# Patient Record
Sex: Male | Born: 1995 | Race: White | Hispanic: No | Marital: Single | Smoking: Former smoker
Health system: Southern US, Community
[De-identification: ages and names within clinical notes are randomized; demographics above are authoritative.]

## PROBLEM LIST (undated history)

## (undated) DIAGNOSIS — F988 Other specified behavioral and emotional disorders with onset usually occurring in childhood and adolescence: Secondary | ICD-10-CM

## (undated) DIAGNOSIS — F419 Anxiety disorder, unspecified: Secondary | ICD-10-CM

## (undated) HISTORY — PX: NO PAST SURGERIES: SHX2092

---
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
## 2018-07-16 ENCOUNTER — Ambulatory Visit
Admission: EM | Admit: 2018-07-16 | Discharge: 2018-07-16 | Disposition: A | Discharge status: Home / Self Care | Payer: BC Managed Care – PPO | Attending: Family Medicine | Admitting: Family Medicine

## 2018-07-16 ENCOUNTER — Other Ambulatory Visit: Payer: Self-pay

## 2018-07-16 ENCOUNTER — Encounter: Payer: Self-pay | Admitting: Emergency Medicine

## 2018-07-16 DIAGNOSIS — R51 Headache: Secondary | ICD-10-CM

## 2018-07-16 DIAGNOSIS — R6883 Chills (without fever): Secondary | ICD-10-CM

## 2018-07-16 DIAGNOSIS — J45909 Unspecified asthma, uncomplicated: Secondary | ICD-10-CM

## 2018-07-16 DIAGNOSIS — J111 Influenza due to unidentified influenza virus with other respiratory manifestations: Secondary | ICD-10-CM

## 2018-07-16 DIAGNOSIS — J029 Acute pharyngitis, unspecified: Secondary | ICD-10-CM

## 2018-07-16 DIAGNOSIS — R05 Cough: Secondary | ICD-10-CM | POA: Diagnosis not present

## 2018-07-16 DIAGNOSIS — R69 Illness, unspecified: Principal | ICD-10-CM

## 2018-07-16 HISTORY — DX: Other specified behavioral and emotional disorders with onset usually occurring in childhood and adolescence: F98.8

## 2018-07-16 HISTORY — DX: Anxiety disorder, unspecified: F41.9

## 2018-07-16 LAB — RAPID STREP SCREEN (MED CTR MEBANE ONLY): Streptococcus, Group A Screen (Direct): NEGATIVE

## 2018-07-16 MED ORDER — FLUTICASONE PROPIONATE 50 MCG/ACT NA SUSP: 1.0000 | Freq: Every day | NASAL | 2 refills | Status: DC

## 2018-07-16 MED ORDER — ALBUTEROL SULFATE HFA 108 (90 BASE) MCG/ACT IN AERS: 1.0000 | INHALATION_SPRAY | Freq: Four times a day (QID) | RESPIRATORY_TRACT | 0 refills | Status: DC | PRN

## 2018-07-16 MED ORDER — OSELTAMIVIR PHOSPHATE 75 MG PO CAPS
75.0000 mg | ORAL_CAPSULE | Freq: Two times a day (BID) | ORAL | 0 refills | Status: DC
Start: 2018-07-16 — End: 2018-11-16

## 2018-07-16 NOTE — ED Triage Notes (Signed)
Pt c/o cough, body aches, sore throat and chills. Started this morning.

## 2018-07-16 NOTE — ED Provider Notes (Signed)
[facility]    CSN: [id number] Arrival date & time: 07/16/18  1949     History   Chief Complaint Chief Complaint  Patient presents with  . Generalized Body Aches    HPI Jason Griffin is a 23 y.o. male.   Patient is a 23 year old male who presents with body aches, cough, headaches, chills, sore throat that started this morning.  Patient is taking no over-the-counter meds but did take some elderberry syrup from his mother.  Patient denies any sick contacts.  He also reports some nausea.  Patient reports some chest pain shortness of breath with his coughing fits but not otherwise.  No abdominal pain.     Past Medical History:  Diagnosis Date  . ADD (attention deficit disorder)   . Anxiety     There are no active problems to display for this patient.   Past Surgical History:  Procedure Laterality Date  . NO PAST SURGERIES        Home Medications    Prior to Admission medications   Medication Sig Start Date End Date Taking? Authorizing Provider  amphetamine-dextroamphetamine (ADDERALL XR) 20 MG 24 hr capsule  07/04/18  Yes [provider]  sertraline (ZOLOFT) 100 MG tablet  04/18/18  Yes [provider]  albuterol (PROVENTIL HFA;VENTOLIN HFA) 108 (90 Base) MCG/ACT inhaler Inhale 1-2 puffs into the lungs every 6 (six) hours as needed for wheezing or shortness of breath. 07/16/18   Candis Schatz, PA-C  fluticasone (FLONASE) 50 MCG/ACT nasal spray Place 1 spray into both nostrils daily. 07/16/18   Candis Schatz, PA-C  oseltamivir (TAMIFLU) 75 MG capsule Take 1 capsule (75 mg total) by mouth every 12 (twelve) hours. 07/16/18   Candis Schatz, PA-C    Family History Family History  Problem Relation Age of Onset  . Autoimmune disease Mother   . Healthy Father     Social History Social History   Tobacco Use  . Smoking status: Current Every Day Smoker    Packs/day: 0.50  . Smokeless tobacco: Never Used  Substance Use Topics  .  Alcohol use: Yes    Comment: occasionally  . Drug use: Not Currently     Allergies   Amoxil [amoxicillin]   Review of Systems Review of Systems  As above HPI.  Other systems reviewed and found to be negative.   Physical Exam Triage Vital Signs ED Triage Vitals  Enc Vitals Group     BP 07/16/18 2019 (!) 143/101     Pulse Rate 07/16/18 2019 (!) 132     Resp 07/16/18 2019 18     Temp 07/16/18 2019 99.8 F (37.7 C)     Temp Source 07/16/18 2019 Oral     SpO2 07/16/18 2019 96 %     Weight 07/16/18 2018 150 lb (68 kg)     Height 07/16/18 2018 5' 9.5" (1.765 m)     Head Circumference --      Peak Flow --      Pain Score 07/16/18 2014 6     Pain Loc --      Pain Edu? --      Excl. in [place]? --    No data found.  Updated Vital Signs BP (!) 143/101 (BP Location: Left Arm)   Pulse (!) 132   Temp 99.8 F (37.7 C) (Oral)   Resp 18   Ht 5' 9.5" (1.765 m)   Wt 150 lb (68 kg)   SpO2 96%  BMI 21.83 kg/m    Physical Exam Constitutional:      Appearance: He is ill-appearing.     Comments: Lying on exam table curled up, wearing acuity with frequent will go over his face  HENT:     Ears:     Comments: Bilateral middle ear effusion    Nose: Nose normal.     Mouth/Throat:     Mouth: Mucous membranes are moist.     Comments: Clear postnasal drainage Eyes:     Extraocular Movements: Extraocular movements intact.     Pupils: Pupils are equal, round, and reactive to light.  Neck:     Musculoskeletal: Normal range of motion.  Cardiovascular:     Rate and Rhythm: Regular rhythm. Tachycardia present.     Heart sounds: No murmur.  Pulmonary:     Effort: Pulmonary effort is normal.     Breath sounds: No wheezing or rhonchi.     Comments: Coughed on forced expiration Abdominal:     General: Abdomen is flat.  Skin:    General: Skin is warm and dry.  Neurological:     General: No focal deficit present.     Mental Status: He is alert and oriented to person, place, and time.        UC Treatments / Results  Labs (all labs ordered are listed, but only abnormal results are displayed) Labs Reviewed  RAPID STREP SCREEN ([facility] ONLY)  CULTURE, GROUP A STREP [facility])    EKG None  Radiology No results found.  Procedures Procedures (including critical care time)  Medications Ordered in UC Medications - No data to display  Initial Impression / Assessment and Plan / UC Course  I have reviewed the triage vital signs and the nursing notes.  Pertinent labs & imaging results that were available during my care of the patient were reviewed by me and considered in my medical decision making (see chart for details).     Patient presents with flulike symptoms that started this morning.  Patient also with a cough with forced expiration.  Patient given prescription for Tamiflu for his flulike illness and albuterol for his reactive airway.  Patient also given prescription for Flonase to help with his drainage.  Patient can take over-the-counter medications as needed for pain.  Push fluids.  Follow with primary care provider as needed.  Final Clinical Impressions(s) / UC Diagnoses   Final diagnoses:  Influenza-like illness  Reactive airway disease without complication, unspecified asthma severity, unspecified whether persistent     Discharge Instructions     -Tamiflu: 1 tablet twice a day for 5 days -Albuterol: 1 to 2 puffs in the lungs every 6 hours as needed for cough or wheezing -Flonase: 1 spray each nostril every morning -Over-the-counter medications as needed for pain -Push fluids -Follow with primary care provider as needed    ED Prescriptions    Medication Sig Dispense Auth. Provider   albuterol (PROVENTIL HFA;VENTOLIN HFA) 108 (90 Base) MCG/ACT inhaler Inhale 1-2 puffs into the lungs every 6 (six) hours as needed for wheezing or shortness of breath. 1 Inhaler Candis Schatz, PA-C   oseltamivir (TAMIFLU) 75 MG capsule Take 1 capsule (75 mg  total) by mouth every 12 (twelve) hours. 10 capsule Candis Schatz, PA-C   fluticasone [facility]) 50 MCG/ACT nasal spray Place 1 spray into both nostrils daily. 16 g Candis Schatz, PA-C     Controlled Substance Prescriptions [place] Controlled Substance Registry consulted? Not Applicable  Candis SchatzHarris, Oren Barella D, PA-C 07/16/18 2151

## 2018-07-16 NOTE — Discharge Instructions (Addendum)
-  Tamiflu: 1 tablet twice a day for 5 days -Albuterol: 1 to 2 puffs in the lungs every 6 hours as needed for cough or wheezing -Flonase: 1 spray each nostril every morning -Over-the-counter medications as needed for pain -Push fluids -Follow with primary care provider as needed

## 2018-07-19 LAB — CULTURE, GROUP A STREP (THRC)

## 2018-11-16 ENCOUNTER — Encounter: Payer: Self-pay | Admitting: Nurse Practitioner

## 2018-11-16 ENCOUNTER — Ambulatory Visit (INDEPENDENT_AMBULATORY_CARE_PROVIDER_SITE_OTHER): Payer: BC Managed Care – PPO | Admitting: Nurse Practitioner

## 2018-11-16 ENCOUNTER — Other Ambulatory Visit: Payer: Self-pay

## 2018-11-16 VITALS — Wt 169.0 lb

## 2018-11-16 DIAGNOSIS — F909 Attention-deficit hyperactivity disorder, unspecified type: Secondary | ICD-10-CM | POA: Diagnosis not present

## 2018-11-16 DIAGNOSIS — F17210 Nicotine dependence, cigarettes, uncomplicated: Secondary | ICD-10-CM

## 2018-11-16 DIAGNOSIS — F411 Generalized anxiety disorder: Secondary | ICD-10-CM

## 2018-11-16 MED ORDER — SERTRALINE HCL 100 MG PO TABS: 100.0000 mg | ORAL_TABLET | Freq: Every day | ORAL | 2 refills | Status: DC

## 2018-11-16 MED ORDER — AMPHETAMINE-DEXTROAMPHET ER 20 MG PO CP24: 20.0000 mg | ORAL_CAPSULE | Freq: Every day | ORAL | 0 refills | Status: DC

## 2018-11-16 NOTE — Patient Instructions (Signed)

## 2018-11-16 NOTE — Assessment & Plan Note (Signed)
Chronic, ongoing.  Continue current medication regimen, Adderall XR 20 MG daily.  Obtain UDS and controlled substance contract at visit in 4 weeks.  Refill sent on Adderall at his time #30 with 0 refills.

## 2018-11-16 NOTE — Progress Notes (Signed)
New Patient Office Visit  Subjective:  Patient ID: Jason Griffin, male    DOB: [birth year]  Age: 23 y.o. MRN: [id number]  CC:  Chief Complaint  Patient presents with  . Establish Care  . Anxiety    . This visit was completed via WebEx due to the restrictions of the COVID-19 pandemic. All issues as above were discussed and addressed. Physical exam was done as above through visual confirmation on WebEx. If it was felt that the patient should be evaluated in the office, they were directed there. The patient verbally consented to this visit. . Location of the patient: home . Location of the provider: home . Those involved with this call:  . Provider: Aura Dials, DNP . CMA: Wilhemena Durie, CMA . Front Desk/Registration: Harriet Pho  . Time spent on call: 15 minutes with patient face to face via video conference. More than 50% of this time was spent in counseling and coordination of care. 10 minutes total spent in review of patient's record and preparation of their chart. I verified patient identity using two factors (patient name and date of birth). Patient consents verbally to being seen via telemedicine visit today.   HPI Jason Griffin presents for new patient visit to establish care.  Introduced to Publishing rights manager role and practice setting.  All questions answered.  ANXIETY/STRESS Currently takes Sertraline 100 MG daily.  Has been present since high school, started taking in 10th grade and reports good control on current dose, not interested in increasing at this time.  Duration:controlled Anxious mood: no  Excessive worrying: no Irritability: no  Sweating: no Nausea: no Palpitations:no Hyperventilation: no Panic attacks: note recently, has been over a year Agoraphobia: no  Obscessions/compulsions: no Depressed mood: at times Depression screen [facility] 2/9 11/16/2018  Decreased Interest 1  Down, Depressed, Hopeless 1  PHQ - 2 Score 2  Altered sleeping 2   Tired, decreased energy 0  Change in appetite 1  Feeling bad or failure about yourself  1  Trouble concentrating 0  Moving slowly or fidgety/restless 0  Suicidal thoughts 0  PHQ-9 Score 6  Difficult doing work/chores Not difficult at all   Anhedonia: no Weight changes: no Insomnia: none Hypersomnia: no Fatigue/loss of energy: no Feelings of worthlessness: no Feelings of guilt: no Impaired concentration/indecisiveness: no Suicidal ideations: no  Crying spells: no Recent Stressors/Life Changes: no   Relationship problems: no   Family stress: no     Financial stress: no    Job stress: no    Recent death/loss: no  GAD 7 : Generalized Anxiety Score 11/16/2018  Nervous, Anxious, on Edge 2  Control/stop worrying 2  Worry too much - different things 2  Trouble relaxing 2  Restless 2  Easily annoyed or irritable 2  Afraid - awful might happen 2  Total GAD 7 Score 14  Anxiety Difficulty Not difficult at all    ADHD FOLLOW UP Currently take Adderall XR 20 MG daily.  Elyn Peers MD at [facility] pediatrics/psychiatric initially diagnosed and prescribed current medications, but he has now aged out of pediatrics and needs to establish with PCP.  Has been on it since high school, took period off after high school, but then restarted as was not able to work well without it.  Does need refills at this time as is out of medication.  We discussed that with Adderall being a controlled substance he will need to have every 3 month visits in office and require drug screen  annually and controlled substance contract.  He states this is similar to this previous practice in [facility] and agrees with this plan.  Reports good control with current regimen and is able to perform well at work.  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    ADHD status: controlled Satisfied with current therapy: yes Medication compliance:  good compliance Controlled substance contract: no  Previous psychiatry evaluation: yes Previous medications: none Taking meds on weekends/vacations: occasionally on Sunday he skips if if not working Work/school performance:  good Difficulty sustaining attention/completing tasks: no Distracted by extraneous stimuli: no Does not listen when spoken to: no  Fidgets with hands or feet: no Unable to stay in seat: no Blurts out/interrupts others: no ADHD Medication Side Effects: no    Decreased appetite: no    Headache: no    Sleeping disturbance pattern: no    Irritability: no    Rebound effects (worse than baseline) off medication: no    Anxiousness: no    Dizziness: no    Tics: no   NICOTINE DEPENDENCE: Smokes 1/2 PPD and is not interested in quitting at this time.  Denies cough, fever, or SOB.  Past Medical History:  Diagnosis Date  . ADD (attention deficit disorder)   . Anxiety     Past Surgical History:  Procedure Laterality Date  . NO PAST SURGERIES      Family History  Problem Relation Age of Onset  . Autoimmune disease Mother   . Arthritis Mother   . Healthy Father   . Dementia Maternal Grandmother     Social History   Socioeconomic History  . Marital status: Single    Spouse name: Not on file  . Number of children: Not on file  . Years of education: Not on file  . Highest education level: Not on file  Occupational History  . Not on file  Social Needs  . Financial resource strain: Not hard at all  . Food insecurity:    Worry: Never true    Inability: Never true  . Transportation needs:    Medical: No    Non-medical: No  Tobacco Use  . Smoking status: Current Every Day Smoker    Packs/day: 0.50  . Smokeless tobacco: Former Engineer, water and Sexual Activity  . Alcohol use: Yes    Comment: occasionally  . Drug use: Never  . Sexual activity: Not Currently  Lifestyle  . Physical activity:    Days per week: 2 days    Minutes per session: 30 min  . Stress: Only a little  Relationships  . Social  connections:    Talks on phone: More than three times a week    Gets together: Three times a week    Attends religious service: Never    Active member of club or organization: No    Attends meetings of clubs or organizations: Never    Relationship status: Never married  . Intimate partner violence:    Fear of current or ex partner: No    Emotionally abused: No    Physically abused: No    Forced sexual activity: No  Other Topics Concern  . Not on file  Social History Narrative  . Not on file    ROS Review of Systems  Constitutional: Negative for activity change, diaphoresis, fatigue and fever.  Respiratory: Negative for cough, chest tightness, shortness of breath and wheezing.   Cardiovascular: Negative for chest pain, palpitations and leg  swelling.  Gastrointestinal: Negative for abdominal distention, abdominal pain, constipation, diarrhea, nausea and vomiting.  Musculoskeletal: Negative.   Skin: Negative.   Neurological: Negative for dizziness, syncope, weakness, light-headedness, numbness and headaches.  Psychiatric/Behavioral: Negative for behavioral problems, decreased concentration, self-injury, sleep disturbance and suicidal ideas. The patient is not nervous/anxious.     Objective:   Today's Vitals: Wt 169 lb (76.7 kg) Comment: pt reported- virtual visit  BMI 24.60 kg/m   Physical Exam Vitals signs and nursing note reviewed.  Constitutional:      General: He is awake. He is not in acute distress.    Appearance: He is well-developed. He is not ill-appearing.  HENT:     Head: Normocephalic.     Right Ear: Hearing normal. No drainage.     Left Ear: Hearing normal. No drainage.  Eyes:     General: Lids are normal.        Right eye: No discharge.        Left eye: No discharge.     Conjunctiva/sclera: Conjunctivae normal.  Neck:     Musculoskeletal: Normal range of motion.  Cardiovascular:     Comments: Unable to auscultate due to virtual exam only. Pulmonary:      Effort: Pulmonary effort is normal. No accessory muscle usage or respiratory distress.     Comments: Unable to auscultate due to virtual exam only. Neurological:     Mental Status: He is alert and oriented to person, place, and time.  Psychiatric:        Mood and Affect: Mood normal.        Behavior: Behavior normal. Behavior is cooperative.        Thought Content: Thought content normal.        Judgment: Judgment normal.     Assessment & Plan:   Problem List Items Addressed This Visit      Other   GAD (generalized anxiety disorder)    Chronic, ongoing with medication since 10th grade.  Continue current medication regimen, Sertraline 100 MG daily.  He denies SI/HI.  Adjust dose as needed.      Relevant Medications   sertraline (ZOLOFT) 100 MG tablet   ADHD (attention deficit hyperactivity disorder) - Primary    Chronic, ongoing.  Continue current medication regimen, Adderall XR 20 MG daily.  Obtain UDS and controlled substance contract at visit in 4 weeks.  Refill sent on Adderall at his time #30 with 0 refills.        Nicotine dependence, cigarettes, uncomplicated    I have recommended complete cessation of tobacco use. I have discussed various options available for assistance with tobacco cessation including over the counter methods (Nicotine gum, patch and lozenges). We also discussed prescription options (Chantix, Nicotine Inhaler / Nasal Spray). The patient is not interested in pursuing any prescription tobacco cessation options at this time.         Outpatient Encounter Medications as of 11/16/2018  Medication Sig  . amphetamine-dextroamphetamine (ADDERALL XR) 20 MG 24 hr capsule Take 1 capsule (20 mg total) by mouth daily.  . sertraline (ZOLOFT) 100 MG tablet Take 1 tablet (100 mg total) by mouth daily.  . [DISCONTINUED] amphetamine-dextroamphetamine (ADDERALL XR) 20 MG 24 hr capsule Take 20 mg by mouth daily.   . [DISCONTINUED] sertraline (ZOLOFT) 100 MG tablet Take 100  mg by mouth daily.   . [DISCONTINUED] albuterol (PROVENTIL HFA;VENTOLIN HFA) 108 (90 Base) MCG/ACT inhaler Inhale 1-2 puffs into the lungs every 6 (six) hours as needed for  wheezing or shortness of breath.  . [DISCONTINUED] fluticasone (FLONASE) 50 MCG/ACT nasal spray Place 1 spray into both nostrils daily.  . [DISCONTINUED] oseltamivir (TAMIFLU) 75 MG capsule Take 1 capsule (75 mg total) by mouth every 12 (twelve) hours.   No facility-administered encounter medications on file as of 11/16/2018.     Follow-up: Return in about 4 weeks (around 12/14/2018) for New patient physical face to face -- needs UDS and labs.   Marjie Skiff, NP   I discussed the assessment and treatment plan with the patient. The patient was provided an opportunity to ask questions and all were answered. The patient agreed with the plan and demonstrated an understanding of the instructions.   The patient was advised to call back or seek an in-person evaluation if the symptoms worsen or if the condition fails to improve as anticipated.   I provided 21+ minutes of time during this encounter.

## 2018-11-16 NOTE — Assessment & Plan Note (Signed)
I have recommended complete cessation of tobacco use. I have discussed various options available for assistance with tobacco cessation including over the counter methods (Nicotine gum, patch and lozenges). We also discussed prescription options (Chantix, Nicotine Inhaler / Nasal Spray). The patient is not interested in pursuing any prescription tobacco cessation options at this time.  

## 2018-11-16 NOTE — Assessment & Plan Note (Addendum)
Chronic, ongoing with medication since 10th grade.  Continue current medication regimen, Sertraline 100 MG daily.  He denies SI/HI.  Adjust dose as needed. 

## 2018-12-01 ENCOUNTER — Other Ambulatory Visit: Payer: Self-pay

## 2018-12-01 ENCOUNTER — Encounter: Payer: Self-pay | Admitting: Emergency Medicine

## 2018-12-01 ENCOUNTER — Ambulatory Visit
Admission: EM | Admit: 2018-12-01 | Discharge: 2018-12-01 | Disposition: A | Discharge status: Home / Self Care | Payer: BC Managed Care – PPO | Attending: Family Medicine | Admitting: Family Medicine

## 2018-12-01 DIAGNOSIS — J039 Acute tonsillitis, unspecified: Secondary | ICD-10-CM | POA: Diagnosis not present

## 2018-12-01 DIAGNOSIS — F17210 Nicotine dependence, cigarettes, uncomplicated: Secondary | ICD-10-CM

## 2018-12-01 LAB — RAPID STREP SCREEN (MED CTR MEBANE ONLY): Streptococcus, Group A Screen (Direct): NEGATIVE

## 2018-12-01 MED ORDER — LIDOCAINE VISCOUS HCL 2 % MT SOLN: OROMUCOSAL | 0 refills | Status: DC

## 2018-12-01 MED ORDER — CLINDAMYCIN HCL 300 MG PO CAPS: 300.0000 mg | ORAL_CAPSULE | Freq: Three times a day (TID) | ORAL | 0 refills | Status: DC

## 2018-12-01 NOTE — ED Triage Notes (Signed)
Patient c/o sore throat that started this morning. Denies fever.

## 2018-12-01 NOTE — ED Provider Notes (Signed)
[facility]    CSN: [id number] Arrival date & time: 12/01/18  1556     History   Chief Complaint Chief Complaint  Patient presents with  . Sore Throat    HPI Jason Griffin is a 23 y.o. male.   23 yo male with a c/o sore throat and pain with swallowing since this morning. Denies any fevers, chills, drooling, difficulty swallowing,  shortness of breath, congestion, cough.    Sore Throat    Past Medical History:  Diagnosis Date  . ADD (attention deficit disorder)   . Anxiety     Patient Active Problem List   Diagnosis Date Noted  . GAD (generalized anxiety disorder) 11/16/2018  . ADHD (attention deficit hyperactivity disorder) 11/16/2018  . Nicotine dependence, cigarettes, uncomplicated 95/02/3266    Past Surgical History:  Procedure Laterality Date  . NO PAST SURGERIES         Home Medications    Prior to Admission medications   Medication Sig Start Date End Date Taking? Authorizing Provider  amphetamine-dextroamphetamine (ADDERALL XR) 20 MG 24 hr capsule Take 1 capsule (20 mg total) by mouth daily. 11/16/18  Yes Cannady, [name] T, NP  sertraline (ZOLOFT) 100 MG tablet Take 1 tablet (100 mg total) by mouth daily. 11/16/18  Yes Cannady, [name] T, NP  clindamycin (CLEOCIN) 300 MG capsule Take 1 capsule (300 mg total) by mouth 3 (three) times daily. 12/01/18   Norval Gable, MD  lidocaine (XYLOCAINE) 2 % solution 20 ml gargle and spit q 6 hours prn sore throat 12/01/18   Norval Gable, MD    Family History Family History  Problem Relation Age of Onset  . Autoimmune disease Mother   . Arthritis Mother   . Healthy Father   . Dementia Maternal Grandmother     Social History Social History   Tobacco Use  . Smoking status: Current Every Day Smoker    Packs/day: 0.50  . Smokeless tobacco: Former Network engineer Use Topics  . Alcohol use: Yes    Comment: occasionally  . Drug use: Never     Allergies   Amoxil [amoxicillin]   Review  of Systems Review of Systems   Physical Exam Triage Vital Signs ED Triage Vitals  Enc Vitals Group     BP 12/01/18 1607 (!) 130/95     Pulse Rate 12/01/18 1607 83     Resp 12/01/18 1607 18     Temp 12/01/18 1607 98.5 F (36.9 C)     Temp Source 12/01/18 1607 Oral     SpO2 12/01/18 1607 99 %     Weight 12/01/18 1608 160 lb (72.6 kg)     Height 12/01/18 1608 5\' 10"  (1.778 m)     Head Circumference --      Peak Flow --      Pain Score 12/01/18 1606 8     Pain Loc --      Pain Edu? --      Excl. in [place]? --    No data found.  Updated Vital Signs BP (!) 130/95 (BP Location: Right Arm)   Pulse 83   Temp 98.5 F (36.9 C) (Oral)   Resp 18   Ht 5\' 10"  (1.778 m)   Wt 72.6 kg   SpO2 99%   BMI 22.96 kg/m   Visual Acuity Right Eye Distance:   Left Eye Distance:   Bilateral Distance:    Right Eye Near:   Left Eye Near:    Bilateral Near:  Physical Exam Vitals signs and nursing note reviewed.  Constitutional:      General: He is not in acute distress.    Appearance: He is not toxic-appearing or diaphoretic.  HENT:     Mouth/Throat:     Pharynx: Oropharyngeal exudate and posterior oropharyngeal erythema present.     Tonsils: Tonsillar exudate present. No tonsillar abscesses. 2+ on the right. 2+ on the left.  Neck:     Musculoskeletal: Neck supple. No neck rigidity or muscular tenderness.  Neurological:     Mental Status: He is alert.      UC Treatments / Results  Labs (all labs ordered are listed, but only abnormal results are displayed) Labs Reviewed  RAPID STREP SCREEN ([facility] ONLY)  CULTURE, GROUP A STREP [facility]    EKG None  Radiology No results found.  Procedures Procedures (including critical care time)  Medications Ordered in UC Medications - No data to display  Initial Impression / Assessment and Plan / UC Course  I have reviewed the triage vital signs and the nursing notes.  Pertinent labs & imaging results that were available  during my care of the patient were reviewed by me and considered in my medical decision making (see chart for details).      Final Clinical Impressions(s) / UC Diagnoses   Final diagnoses:  Tonsillitis    ED Prescriptions    Medication Sig Dispense Auth. Provider   clindamycin (CLEOCIN) 300 MG capsule Take 1 capsule (300 mg total) by mouth 3 (three) times daily. 30 capsule Zuhayr Deeney, MD   lidocaine (XYLOCAINE) 2 % solution 20 ml gargle and spit q 6 hours prn sore throat 100 mL Payton Mccallumonty, Jianna Drabik, MD     1. Lab results and diagnosis reviewed with patient 2. rx as per orders above; reviewed possible side effects, interactions, risks and benefits  3. Recommend supportive treatment with otc analgesics prn 4. Follow-up prn if symptoms worsen or don't improve  Controlled Substance Prescriptions [place] Controlled Substance Registry consulted? Not Applicable   Payton Mccallumonty, Novalynn Branaman, MD 12/01/18 [phone]

## 2018-12-04 LAB — CULTURE, GROUP A STREP (THRC)

## 2018-12-21 ENCOUNTER — Telehealth: Payer: Self-pay | Admitting: Nurse Practitioner

## 2018-12-21 ENCOUNTER — Other Ambulatory Visit: Payer: Self-pay | Admitting: Nurse Practitioner

## 2018-12-21 MED ORDER — AMPHETAMINE-DEXTROAMPHET ER 20 MG PO CP24: 20.0000 mg | ORAL_CAPSULE | Freq: Every day | ORAL | 0 refills | Status: DC

## 2018-12-21 NOTE — Telephone Encounter (Signed)
Please let patient know he needs to schedule face to face visit for controlled substance contract and UDS as we discussed at first visit.  Once this is scheduled I will send in refill for enough medication to last until this appointment and then we can go from there once he is seen face to face.  Thanks.

## 2018-12-21 NOTE — Progress Notes (Signed)
Adderall refill.  Scheduled for Monday to obtain UDS and sign substance contract.  Controlled substance.  Checked data base and no other refills of controlled substances noted, last fill 11/16/18.

## 2018-12-21 NOTE — Telephone Encounter (Signed)
Medication: amphetamine-dextroamphetamine (ADDERALL XR) 20 MG 24 hr capsule     Patient is requesting refill of this medication. Patient states that he has one tablet left.     Pharmacy:  [id number] - [place], [address] [phone] (Phone) [phone] (Fax)

## 2018-12-21 NOTE — Telephone Encounter (Signed)
Pt is scheduled for Monday

## 2018-12-21 NOTE — Telephone Encounter (Signed)
Thank you. Refill sent.

## 2018-12-24 ENCOUNTER — Ambulatory Visit: Payer: BC Managed Care – PPO | Admitting: Nurse Practitioner

## 2019-01-25 ENCOUNTER — Other Ambulatory Visit: Payer: Self-pay | Admitting: Nurse Practitioner

## 2019-01-25 ENCOUNTER — Other Ambulatory Visit: Payer: Self-pay

## 2019-01-25 ENCOUNTER — Ambulatory Visit (INDEPENDENT_AMBULATORY_CARE_PROVIDER_SITE_OTHER): Payer: BC Managed Care – PPO | Admitting: Nurse Practitioner

## 2019-01-25 ENCOUNTER — Encounter: Payer: Self-pay | Admitting: Nurse Practitioner

## 2019-01-25 VITALS — BP 137/83 | HR 70 | Temp 98.7°F | Wt 169.0 lb

## 2019-01-25 DIAGNOSIS — F40298 Other specified phobia: Secondary | ICD-10-CM | POA: Diagnosis not present

## 2019-01-25 DIAGNOSIS — Z9114 Patient's other noncompliance with medication regimen: Secondary | ICD-10-CM | POA: Insufficient documentation

## 2019-01-25 DIAGNOSIS — Z79899 Other long term (current) drug therapy: Secondary | ICD-10-CM

## 2019-01-25 DIAGNOSIS — F909 Attention-deficit hyperactivity disorder, unspecified type: Secondary | ICD-10-CM | POA: Diagnosis not present

## 2019-01-25 MED ORDER — AMPHETAMINE-DEXTROAMPHET ER 20 MG PO CP24: 20.0000 mg | ORAL_CAPSULE | Freq: Every day | ORAL | 0 refills | Status: DC

## 2019-01-25 NOTE — Assessment & Plan Note (Signed)
Signed along with patient on visit today, along with UDS.

## 2019-01-25 NOTE — Progress Notes (Signed)
BP 137/83   Pulse 70   Temp 98.7 F (37.1 C) (Oral)   Wt 169 lb (76.7 kg)   SpO2 98%   BMI 24.25 kg/m    Subjective:    Patient ID: Jason Griffin, male    DOB: 1995-09-07, 23 y.o.   MRN: [id number]  HPI: JAVANI SPRATT is a 23 y.o. male  Chief Complaint  Patient presents with  . Follow-up    adderall refill  . ADHD   ADHD FOLLOW UP Currently take Adderall XR 20 MG daily.  Gae Gallop MD at [facility] pediatrics/psychiatric initially diagnosed and prescribed current medications, but he has now aged out of pediatrics and needs to establish with PCP.  Has been on it since high school, took period off after high school, but then restarted as was not able to work well without it.   Reports good control with current regimen and is able to perform well at work.  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   Does endorse occasional marijuana use, once to twice a month.  Have discussed with him to cut back and attempt cessation from this.  No other drug use. ADHD status: controlled Satisfied with current therapy: yes Medication compliance:  good compliance Controlled substance contract: no Previous psychiatry evaluation: yes Previous medications: none Taking meds on weekends/vacations: occasionally on Sunday he skips if if not working Work/school performance:  good Difficulty sustaining attention/completing tasks: no Distracted by extraneous stimuli: no Does not listen when spoken to: no  Fidgets with hands or feet: no Unable to stay in seat: no Blurts out/interrupts others: no ADHD Medication Side Effects: no    Decreased appetite: no    Headache: no    Sleeping disturbance pattern: no    Irritability: no    Rebound effects (worse than baseline) off medication: no    Anxiousness: no    Dizziness: no    Tics: no   Relevant past medical, surgical, family and social history reviewed and updated as indicated. Interim medical history since  our last visit reviewed. Allergies and medications reviewed and updated.  Review of Systems  Constitutional: Negative for activity change, diaphoresis, fatigue and fever.  Respiratory: Negative for cough, chest tightness, shortness of breath and wheezing.   Cardiovascular: Negative for chest pain, palpitations and leg swelling.  Gastrointestinal: Negative for abdominal distention, abdominal pain, constipation, diarrhea, nausea and vomiting.  Neurological: Negative for dizziness, syncope, weakness, light-headedness, numbness and headaches.  Psychiatric/Behavioral: Negative.     Per HPI unless specifically indicated above     Objective:    BP 137/83   Pulse 70   Temp 98.7 F (37.1 C) (Oral)   Wt 169 lb (76.7 kg)   SpO2 98%   BMI 24.25 kg/m   Wt Readings from Last 3 Encounters:  01/25/19 169 lb (76.7 kg)  12/01/18 160 lb (72.6 kg)  11/16/18 169 lb (76.7 kg)    Physical Exam Vitals signs and nursing note reviewed.  Constitutional:      General: He is awake. He is not in acute distress.    Appearance: He is well-developed. He is not ill-appearing.  HENT:     Head: Normocephalic and atraumatic.     Right Ear: Hearing normal. No drainage.     Left Ear: Hearing normal. No drainage.  Eyes:     General: Lids are normal.        Right eye: No discharge.  Left eye: No discharge.     Conjunctiva/sclera: Conjunctivae normal.     Pupils: Pupils are equal, round, and reactive to light.  Neck:     Musculoskeletal: Normal range of motion and neck supple.     Thyroid: No thyromegaly.  Cardiovascular:     Rate and Rhythm: Normal rate and regular rhythm.     Heart sounds: Normal heart sounds, S1 normal and S2 normal. No murmur. No gallop.   Pulmonary:     Effort: Pulmonary effort is normal. No accessory muscle usage or respiratory distress.     Breath sounds: Normal breath sounds.  Abdominal:     General: Bowel sounds are normal.     Palpations: Abdomen is soft. There is no  hepatomegaly or splenomegaly.  Musculoskeletal: Normal range of motion.     Right lower leg: No edema.     Left lower leg: No edema.  Skin:    General: Skin is warm and dry.  Neurological:     Mental Status: He is alert and oriented to person, place, and time.     Deep Tendon Reflexes: Reflexes are normal and symmetric.  Psychiatric:        Mood and Affect: Mood normal.        Behavior: Behavior normal. Behavior is cooperative.        Thought Content: Thought content normal.        Judgment: Judgment normal.     Results for orders placed or performed during the hospital encounter of 12/01/18  Rapid Strep Screen ([facility] ONLY)   Specimen: Oral Mucosa/Gingiva; Other  Result Value Ref Range   Streptococcus, Group A Screen (Direct) NEGATIVE NEGATIVE  Culture, group A strep   Specimen: Throat  Result Value Ref Range   Specimen Description      THROAT Performed at [facility], [address] [place], [place] [address]    Special Requests      NONE Reflexed from [id number] Performed at [facility], [address] [place], [place] [address]    Culture      NO GROUP A STREP (S.PYOGENES) ISOLATED Performed at [facility], [address] [place], [place] [address]    Report Status 12/04/2018 FINAL       Assessment & Plan:   Problem List Items Addressed This Visit      Other   ADHD (attention deficit hyperactivity disorder) - Primary    Chronic, ongoing.  Continue current regimen, Adderall XR 20 MG (30 tablets + 2 refills ordered -- fill dates included for refills).  UDS ordered and controlled substance contract signed.  Return in 3 months.      Relevant Orders   Urine drugs of abuse scrn w alc, routine (Ref Lab)   Controlled substance agreement signed    Signed along with patient on visit today, along with UDS.      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         Controlled substance.  Checked  data base and no other refills of controlled substances noted, last fill 12/21/2018 Adderrall.  Follow up plan: Return in about 3 months (around 04/27/2019) for ADHD and Anxiety.

## 2019-01-25 NOTE — Assessment & Plan Note (Signed)
Chronic, ongoing.  Continue current regimen, Adderall XR 20 MG (30 tablets + 2 refills ordered -- fill dates included for refills).  UDS ordered and controlled substance contract signed.  Return in 3 months.

## 2019-01-25 NOTE — Patient Instructions (Signed)
Attention Deficit Hyperactivity Disorder, Adult Attention deficit hyperactivity disorder (ADHD) is a mental health disorder that starts during childhood. For many people with ADHD, the disorder continues into adult years. There are many things that you and your health care provider or therapist (mental health professional) can do to manage your symptoms. What are the causes? The exact cause of ADHD is not known. What increases the risk? You are more likely to develop this condition if:  You have a family history of ADHD.  You are male.  You were born to a mother who smoked or drank alcohol during pregnancy.  You were exposed to lead poisoning or other toxins in the womb or in early life.  You were born before 37 weeks of pregnancy (prematurely) or you had a low birth weight.  You have experienced a brain injury. What are the signs or symptoms? Symptoms of this condition depend on the type of ADHD. The two main types are inattentive and hyperactive-impulsive. Some people may have symptoms of both types. Symptoms of the inattentive type include:  Difficulty watching, listening, or thinking with focused effort (paying attention).  Making careless mistakes.  Not listening.  Not following instructions.  Being disorganized.  Avoiding tasks that require time and attention.  Losing things.  Forgetting things.  Being easily distracted. Symptoms of the hyperactive-impulsive type include:  Restlessness.  Talking too much.  Interrupting.  Difficulty with: ? Sitting still. ? Staying quiet. ? Feeling motivated. ? Relaxing. ? Waiting in line or waiting for a turn. How is this diagnosed? This condition is diagnosed based on your current symptoms and your history of symptoms. The diagnosis can be made by a provider such as a primary care provider, psychiatrist, psychologist, or clinical social worker. The provider may use a symptom checklist or a standardized behavior rating  scale to evaluate your symptoms. He or she may want to talk with family members who have known you for a long time and have observed your behaviors. There are no lab tests or brain imaging tests that can diagnose ADHD. How is this treated? This condition can be treated with medicines and behavior therapy. Medicines may be the best option to reduce impulsive behaviors and improve attention. Your health care provider may recommend:  Stimulant medicines. These are the most common medicines used for adult ADHD. They affect certain chemicals in the brain (neurotransmitters). These medicines may be long-acting or short-acting. This will determine how often you need to take the medicine.  A non-stimulant medicine for adult ADHD (atomoxetine). This medicine increases a neurotransmitter called norepinephrine. It may take weeks to months to see effects from this medicine. Psychotherapy and behavioral management are also important for treating ADHD. Psychotherapy is often used along with medicine. Your health care provider may suggest:  Cognitive behavioral therapy (CBT). This type of therapy teaches you to replace negative thoughts and actions with positive thoughts and actions. When used as part of ADHD treatment, this therapy may also include: ? Coping strategies for organization, time management, impulse control, and stress reduction. ? Mindfulness and meditation training.  Behavioral management. This may include strategies for organization and time management. You may work with an ADHD coach who is specially trained to help people with ADHD to manage and organize activities and to function more effectively. Follow these instructions at home: Medicines   Take over-the-counter and prescription medicines only as told by your health care provider.  Talk with your health care provider about the possible side effects of your   medicine to watch for. General instructions   Learn as much as you can about  adult ADHD, and work closely with your health care providers to find the treatments that work best for you.  Do not use drugs or abuse alcohol. Limit alcohol intake to no more than 1 drink a day for nonpregnant women and 2 drinks a day for men. One drink equals 12 oz of beer, 5 oz of wine, or 1 oz of hard liquor.  Follow the same schedule each day. Make sure your schedule includes enough time for you to get plenty of sleep.  Use reminder devices like notes, calendars, and phone apps to stay on-time and organized.  Eat a healthy diet. Do not skip meals.  Exercise regularly. Exercise can help to reduce stress and anxiety.  Keep all follow-up visits as told by your health care provider and therapist. This is important. Where to find more information  A health care provider may be able to recommend resources that are available online or over the phone. You could start with: ? Attention Deficit Disorder Association (ADDA): www.add.org ? National Institute of Mental Health (NIMH): www.nimh.nih.gov Contact a health care provider if:  Your symptoms are changing, getting worse, or not improving.  You have side effects from your medicine, such as: ? Repeated muscle twitches, coughing, or speech outbursts. ? Sleep problems. ? Loss of appetite. ? Depression. ? New or worsening behavior problems. ? Dizziness. ? Unusually fast heartbeat. ? Stomach pains. ? Headaches.  You are struggling with anxiety, depression, or substance abuse. Get help right away if:  You have a severe reaction to a medicine.  You have thoughts of hurting yourself or others. If you ever feel like you may hurt yourself or others, or have thoughts about taking your own life, get help right away. You can go to the nearest emergency department or call:  Your local emergency services (911 in the [place]  A suicide crisis helpline, such as the National Suicide Prevention Lifeline at 1-800-273-8255. This is open 24 hours a  day. Summary  ADHD is a mental health disorder that starts during childhood and often continues into adult years.  The exact cause of ADHD is not known.  There is no cure for ADHD, but treatment with medicine, therapy, or behavioral training can help you manage your condition. This information is not intended to replace advice given to you by your health care provider. Make sure you discuss any questions you have with your health care provider. Document Released: 01/19/2017 Document Revised: 05/12/2017 Document Reviewed: 01/19/2017 Elsevier Patient Education  2020 Elsevier Inc.  

## 2019-01-25 NOTE — Assessment & Plan Note (Signed)
Refuses baseline labs today, has severe needle phobia.

## 2019-01-29 LAB — PANEL 799049
CARBOXY THC GC/MS CONF: 189 ng/mL
Cannabinoid GC/MS, Ur: POSITIVE — AB

## 2019-01-29 LAB — URINE DRUGS OF ABUSE SCREEN W ALC, ROUTINE (REF LAB)
Barbiturate Quant, Ur: NEGATIVE ng/mL
Benzodiazepine Quant, Ur: NEGATIVE ng/mL
Cocaine (Metab.): NEGATIVE ng/mL
Ethanol, Urine: NEGATIVE %
Methadone Screen, Urine: NEGATIVE ng/mL
Opiate Quant, Ur: NEGATIVE ng/mL
PCP Quant, Ur: NEGATIVE ng/mL
Propoxyphene: NEGATIVE ng/mL

## 2019-01-29 LAB — AMPHETAMINE CONF, UR
Amphetamine GC/MS Conf: 3750 ng/mL
Amphetamine: POSITIVE — AB
Amphetamines: POSITIVE — AB
Methamphetamine: NEGATIVE

## 2019-04-30 ENCOUNTER — Other Ambulatory Visit: Payer: Self-pay

## 2019-05-01 ENCOUNTER — Ambulatory Visit: Payer: BC Managed Care – PPO | Admitting: Family Medicine

## 2019-05-02 ENCOUNTER — Telehealth: Payer: Self-pay | Admitting: Nurse Practitioner

## 2019-05-02 ENCOUNTER — Other Ambulatory Visit: Payer: Self-pay | Admitting: Nurse Practitioner

## 2019-05-02 MED ORDER — AMPHETAMINE-DEXTROAMPHET ER 20 MG PO CP24: 20.0000 mg | ORAL_CAPSULE | Freq: Every day | ORAL | 0 refills | Status: DC

## 2019-05-02 NOTE — Telephone Encounter (Signed)
Pt needs refill on generic adderall xr 20 mg. [facility] [address]

## 2019-05-02 NOTE — Telephone Encounter (Signed)
Routing to provider  

## 2019-05-02 NOTE — Telephone Encounter (Signed)
Refill sent.

## 2019-05-26 ENCOUNTER — Encounter: Payer: Self-pay | Admitting: Nurse Practitioner

## 2019-05-26 DIAGNOSIS — F1290 Cannabis use, unspecified, uncomplicated: Secondary | ICD-10-CM | POA: Insufficient documentation

## 2019-05-27 ENCOUNTER — Ambulatory Visit: Payer: BC Managed Care – PPO | Admitting: Nurse Practitioner

## 2019-06-03 ENCOUNTER — Telehealth: Payer: Self-pay | Admitting: Nurse Practitioner

## 2019-06-03 ENCOUNTER — Other Ambulatory Visit: Payer: Self-pay | Admitting: Nurse Practitioner

## 2019-06-03 MED ORDER — AMPHETAMINE-DEXTROAMPHET ER 20 MG PO CP24: 20.0000 mg | ORAL_CAPSULE | Freq: Every day | ORAL | 0 refills | Status: DC

## 2019-06-03 NOTE — Telephone Encounter (Signed)
Pt request refill  amphetamine-dextroamphetamine (ADDERALL XR) 20 MG 24 hr capsule  Pt has rescheduled his appt and hopes he can get his refill asap.  Pt aware [name] is out this week and hopes another dr will give him enough to get through to his appt.  [id number] - [place], [place] - [address] Phone:  [phone]  Fax:  [phone]

## 2019-06-03 NOTE — Telephone Encounter (Signed)
Please let Jason Griffin know I ordered refills to his pharmacy, but he HAS TO make his upcoming appointment to ensure he gets further refills past his current 30 day supply sent.  Thank you.

## 2019-06-03 NOTE — Telephone Encounter (Signed)
Called pt to let him know of jolene's message, pt verbalized understanding

## 2019-06-11 ENCOUNTER — Other Ambulatory Visit: Payer: Self-pay

## 2019-06-11 ENCOUNTER — Encounter: Payer: Self-pay | Admitting: Nurse Practitioner

## 2019-06-11 ENCOUNTER — Ambulatory Visit (INDEPENDENT_AMBULATORY_CARE_PROVIDER_SITE_OTHER): Payer: BC Managed Care – PPO | Admitting: Nurse Practitioner

## 2019-06-11 VITALS — BP 96/57 | HR 55 | Temp 97.6°F

## 2019-06-11 DIAGNOSIS — F411 Generalized anxiety disorder: Secondary | ICD-10-CM

## 2019-06-11 DIAGNOSIS — F1290 Cannabis use, unspecified, uncomplicated: Secondary | ICD-10-CM | POA: Diagnosis not present

## 2019-06-11 DIAGNOSIS — F17210 Nicotine dependence, cigarettes, uncomplicated: Secondary | ICD-10-CM | POA: Diagnosis not present

## 2019-06-11 DIAGNOSIS — F909 Attention-deficit hyperactivity disorder, unspecified type: Secondary | ICD-10-CM | POA: Diagnosis not present

## 2019-06-11 MED ORDER — AMPHETAMINE-DEXTROAMPHET ER 20 MG PO CP24: 20.0000 mg | ORAL_CAPSULE | Freq: Every day | ORAL | 0 refills | Status: DC

## 2019-06-11 NOTE — Patient Instructions (Signed)

## 2019-06-11 NOTE — Assessment & Plan Note (Signed)
Chronic, ongoing.  Continue current regimen, Adderall XR 20 MG (30 tablets + 2 refills ordered -- fill dates included for refills).  Return in 3 months.  Have recommended complete cessation of MJ use, if ongoing noted on UDS may need to consider no further fills on this medication.

## 2019-06-11 NOTE — Assessment & Plan Note (Signed)
Recommend complete cessation of MJ use, if ongoing noted on future UDS may need to consider no further refills on Adderall. 

## 2019-06-11 NOTE — Assessment & Plan Note (Signed)
Chronic, ongoing with medication since 10th grade.  Continue current medication regimen, Sertraline 100 MG daily.  He denies SI/HI.  Adjust dose as needed.

## 2019-06-11 NOTE — Progress Notes (Addendum)
BP (!) 96/57   Pulse (!) 55   Temp 97.6 F (36.4 C) (Oral)   SpO2 99%    Subjective:    Patient ID: Jason Griffin, male    DOB: November 13, 1995, 23 y.o.   MRN: [id number]  HPI: Jason Griffin is a 23 y.o. male  Chief Complaint  Patient presents with  . ADHD  . Anxiety   ADHD FOLLOW UP Currently take Adderall XR 20 MG daily. Jason PeersMassey Williamson MD [facility] initially diagnosed and prescribed current medications, but aged out.  Has been on it since high school, took period off after high school, but then restarted as was not able to work well without it.  Reports good control with current regimen and is able to perform well at work. Does endorse occasional marijuana use, once to twice a month.  Have discussed with him to cut back and attempt cessation from this.  No other drug use. ADHD status:controlled Satisfied with current therapy:yes Medication compliance:good compliance Controlled substance contract:no Previous psychiatry evaluation:yes Previous medications:none Taking meds on weekends/vacations:occasionally on Sunday he skips if if not working Work/school performance:good Difficulty sustaining attention/completing tasks:no Distracted by extraneous stimuli: no Does not listen when spoken to:no Fidgets with hands or feet:no Unable to stay in seat:no Blurts out/interrupts others:no ADHD Medication Side Effects:no Decreased appetite: no Headache: no Sleeping disturbance pattern: no Irritability: no Rebound effects (worse than baseline) off medication: no Anxiousness: no Dizziness: no Tics: no  ANXIETY/STRESS Sertraline 100 MG daily.  He does continue to smoke, 1 PPD, but is cutting back to lower amounts with vaping.   Duration:stable Anxious mood: no  Excessive worrying: no Irritability: no  Sweating: no Nausea: no Palpitations:no Hyperventilation: no Panic attacks: no Agoraphobia: no    Obscessions/compulsions: no Depressed mood: no Depression screen [facility] 06/11/2019 11/16/2018  Decreased Interest 2 1  Down, Depressed, Hopeless 2 1  PHQ - 2 Score 4 2  Altered sleeping 3 2  Tired, decreased energy 0 0  Change in appetite 2 1  Feeling bad or failure about yourself  1 1  Trouble concentrating 0 0  Moving slowly or fidgety/restless 0 0  Suicidal thoughts 0 0  PHQ-9 Score 10 6  Difficult doing work/chores Somewhat difficult Not difficult at all   Anhedonia: no Weight changes: no Insomnia: occasionally Hypersomnia: no Fatigue/loss of energy: no Feelings of worthlessness: no Feelings of guilt: no Impaired concentration/indecisiveness: no Suicidal ideations: no  Crying spells: no Recent Stressors/Life Changes: yes, due to recently cutting back on drinking and working towards cessation, this was after obtaining DUI   Relationship problems: no   Family stress: no     Financial stress: no    Job stress: no    Recent death/loss: no GAD 7 : Generalized Anxiety Score 06/11/2019 11/16/2018  Nervous, Anxious, on Edge 1 2  Control/stop worrying 1 2  Worry too much - different things 1 2  Trouble relaxing 0 2  Restless 0 2  Easily annoyed or irritable 2 2  Afraid - awful might happen 1 2  Total GAD 7 Score 6 14  Anxiety Difficulty Not difficult at all Not difficult at all    Relevant past medical, surgical, family and social history reviewed and updated as indicated. Interim medical history since our last visit reviewed. Allergies and medications reviewed and updated.  Review of Systems  Constitutional: Negative for activity change, diaphoresis, fatigue and fever.  Respiratory: Negative for cough, chest tightness, shortness of breath and  wheezing.   Cardiovascular: Negative for chest pain, palpitations and leg swelling.  Gastrointestinal: Negative for abdominal distention, abdominal pain, constipation, diarrhea, nausea and vomiting.  Neurological: Negative for  dizziness, syncope, weakness, light-headedness, numbness and headaches.  Psychiatric/Behavioral: Negative.     Relevant past medical, surgical, family and social history reviewed and updated as indicated. Interim medical history since our last visit reviewed. Allergies and medications reviewed and updated.  Review of Systems  Constitutional: Negative for activity change, diaphoresis, fatigue and fever.  Respiratory: Negative for cough, chest tightness, shortness of breath and wheezing.   Cardiovascular: Negative for chest pain, palpitations and leg swelling.  Gastrointestinal: Negative for abdominal distention, abdominal pain, constipation, diarrhea, nausea and vomiting.  Neurological: Negative for dizziness, syncope, weakness, light-headedness, numbness and headaches.  Psychiatric/Behavioral: Negative.     Per HPI unless specifically indicated above     Objective:    BP (!) 96/57   Pulse (!) 55   Temp 97.6 F (36.4 C) (Oral)   SpO2 99%   Wt Readings from Last 3 Encounters:  01/25/19 169 lb (76.7 kg)  12/01/18 160 lb (72.6 kg)  11/16/18 169 lb (76.7 kg)    Physical Exam Vitals and nursing note reviewed.  Constitutional:      Appearance: He is well-developed.  HENT:     Head: Normocephalic and atraumatic.     Right Ear: Hearing normal. No drainage.     Left Ear: Hearing normal. No drainage.     Mouth/Throat:     Pharynx: Uvula midline.  Eyes:     General: Lids are normal.        Right eye: No discharge.        Left eye: No discharge.     Conjunctiva/sclera: Conjunctivae normal.     Pupils: Pupils are equal, round, and reactive to light.  Neck:     Thyroid: No thyromegaly.     Vascular: No carotid bruit or JVD.     Trachea: Trachea normal.  Cardiovascular:     Rate and Rhythm: Normal rate and regular rhythm.     Heart sounds: Normal heart sounds, S1 normal and S2 normal. No murmur. No gallop.   Pulmonary:     Effort: Pulmonary effort is normal.     Breath  sounds: Normal breath sounds.  Abdominal:     General: Bowel sounds are normal.     Palpations: Abdomen is soft. There is no hepatomegaly or splenomegaly.  Musculoskeletal:        General: Normal range of motion.     Cervical back: Normal range of motion and neck supple.     Right lower leg: No edema.     Left lower leg: No edema.  Skin:    General: Skin is warm and dry.  Neurological:     Mental Status: He is alert and oriented to person, place, and time.  Psychiatric:        Mood and Affect: Mood normal.        Behavior: Behavior normal.        Thought Content: Thought content normal.        Judgment: Judgment normal.    Results for orders placed or performed in visit on 01/25/19  Urine drugs of abuse scrn w alc, routine (Ref Lab)  Result Value Ref Range   Amphetamines, Urine See Final Results Cutoff=1000 ng/mL   Barbiturate Quant, Ur Negative Cutoff=300 ng/mL   Benzodiazepine Quant, Ur Negative Cutoff=300 ng/mL   Cannabinoid Quant, Ur See Final  Results Cutoff=50 ng/mL   Cocaine (Metab.) Negative Cutoff=300 ng/mL   Opiate Quant, Ur Negative Cutoff=300 ng/mL   PCP Quant, Ur Negative Cutoff=25 ng/mL   Methadone Screen, Urine Negative Cutoff=300 ng/mL   Propoxyphene Negative Cutoff=300 ng/mL   Ethanol, Urine Negative Cutoff=0.020 %  Amphetamine Conf, Ur  Result Value Ref Range   Amphetamines Positive (A) Cutoff=1000   Amphetamine Positive (A)    Amphetamine GC/MS Conf 3,750 Cutoff=500 ng/mL   Methamphetamine Negative Cutoff=500  Panel [id number]  Result Value Ref Range   Cannabinoid GC/MS, Ur Positive (A) Cutoff=50   CARBOXY THC GC/MS CONF 189 Cutoff=10 ng/mL      Assessment & Plan:   Problem List Items Addressed This Visit      Other   GAD (generalized anxiety disorder)    Chronic, ongoing with medication since 10th grade.  Continue current medication regimen, Sertraline 100 MG daily.  He denies SI/HI.  Adjust dose as needed.      ADHD (attention deficit  hyperactivity disorder) - Primary    Chronic, ongoing.  Continue current regimen, Adderall XR 20 MG (30 tablets + 2 refills ordered -- fill dates included for refills).  Return in 3 months.  Have recommended complete cessation of MJ use, if ongoing noted on UDS may need to consider no further fills on this medication.      Nicotine dependence, cigarettes, uncomplicated    I have recommended complete cessation of tobacco use. I have discussed various options available for assistance with tobacco cessation including over the counter methods (Nicotine gum, patch and lozenges). We also discussed prescription options (Chantix, Nicotine Inhaler / Nasal Spray). The patient is not interested in pursuing any prescription tobacco cessation options at this time.       Marijuana use    Recommend complete cessation of MJ use, if ongoing noted on future UDS may need to consider no further refills on Adderall.         Controlled substance.  Checked data base and no other refills of controlled substances noted, last fill 06/03/2019 Adderall.  No other controlled substance fills noted.   Follow up plan: Return in about 3 months (around 09/09/2019) for ADHD.

## 2019-06-11 NOTE — Assessment & Plan Note (Signed)
I have recommended complete cessation of tobacco use. I have discussed various options available for assistance with tobacco cessation including over the counter methods (Nicotine gum, patch and lozenges). We also discussed prescription options (Chantix, Nicotine Inhaler / Nasal Spray). The patient is not interested in pursuing any prescription tobacco cessation options at this time.  

## 2019-07-01 ENCOUNTER — Other Ambulatory Visit: Payer: Self-pay | Admitting: Nurse Practitioner

## 2019-07-01 MED ORDER — AMPHETAMINE-DEXTROAMPHET ER 20 MG PO CP24: 20.0000 mg | ORAL_CAPSULE | Freq: Every day | ORAL | 0 refills | Status: DC

## 2019-07-01 NOTE — Telephone Encounter (Signed)
Routing to provider  

## 2019-07-01 NOTE — Telephone Encounter (Signed)
I sent refill on this, but based on his last fill on 06/03/2019, the earliest he can fill this is 07/03/2019.  Thank you.

## 2019-07-01 NOTE — Telephone Encounter (Signed)
Medication Refill - Medication: amphetamine-dextroamphetamine (ADDERALL XR) 20 MG 24 hr capsule   Preferred Pharmacy (with phone number or street name):  [id number] [place], [place] - [address] Phone:  [phone]  Fax:  [phone]       Agent: Please be advised that RX refills may take up to 3 business days. We ask that you follow-up with your pharmacy.

## 2019-07-03 NOTE — Telephone Encounter (Signed)
Patient notified. Patient said he was just calling a few days early so his medicine will be ready at the pharmacy for him.

## 2019-08-02 ENCOUNTER — Other Ambulatory Visit: Payer: Self-pay | Admitting: Nurse Practitioner

## 2019-08-02 ENCOUNTER — Telehealth: Payer: Self-pay | Admitting: Nurse Practitioner

## 2019-08-02 MED ORDER — AMPHETAMINE-DEXTROAMPHET ER 20 MG PO CP24: 20.0000 mg | ORAL_CAPSULE | Freq: Every day | ORAL | 0 refills | Status: DC

## 2019-08-02 NOTE — Telephone Encounter (Signed)
Refill sent in

## 2019-08-02 NOTE — Telephone Encounter (Signed)
RX REFILL amphetamine-dextroamphetamine (ADDERALL XR) 20 MG 24 hr capsule [[id number]]  PHARMACY [id number] - [place], [place] - [address] Phone:  [phone]  Fax:  [phone]

## 2019-08-02 NOTE — Telephone Encounter (Signed)
Patient notified

## 2019-08-02 NOTE — Telephone Encounter (Signed)
Patient last seen 06/11/19 and has appointment 09/09/19

## 2019-08-12 ENCOUNTER — Telehealth: Payer: Self-pay | Admitting: Nurse Practitioner

## 2019-08-12 NOTE — Telephone Encounter (Signed)
Copied from CRM [id number]. Topic: General - Other >> Aug 12, 2019 10:26 AM Angela Nevin wrote: Patient's mother requesting to speak with [facility] regarding patient. She would not disclose any information other than it is very important and she has some things that she needs to let Jason Griffin know. >> Aug 12, 2019  4:44 PM Daphine Deutscher D wrote: Pt's mom states she realizes that [facility] can not give her any information by law but that she really feels that she needs to let her know some things regarding her son and some incidents that have happened lately.  >> Aug 12, 2019  4:35 PM Daphine Deutscher D wrote: Pt's mom only wants Jason Griffin to call her back not Jason Griffin.  Mom said she needs to give Jason Griffin some very important information regarding her son.  Mom's call back # [phone]

## 2019-08-12 NOTE — Telephone Encounter (Signed)
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  She reports patient is not in the best place.  Has gotten in trouble for drinking at work. Then recently on one morning she could not get him out of bed, she went to his work to alert them, that is when they told her he was going out in the alley where he [place] and drinking.  Has to take 20 hours in substance abuse training.  He was previously being followed for therapy by Elyn Peers and a Lear Ng MD initially diagnosed him and started medications ADHD and mood.  His workplace [employer], currently had new owners and she is concerned that patient will lose his job.   Patient mother not on DPR list and is aware I can not release information.  Did recommend that it would be beneficial to take him into [facility] services due to concerns substance abuse, educated her on services provided at [facility].  She plans on attempting to get him into clinic there.

## 2019-08-26 ENCOUNTER — Other Ambulatory Visit: Payer: Self-pay | Admitting: Nurse Practitioner

## 2019-08-26 MED ORDER — AMPHETAMINE-DEXTROAMPHET ER 20 MG PO CP24: 20.0000 mg | ORAL_CAPSULE | Freq: Every day | ORAL | 0 refills | Status: DC

## 2019-08-26 NOTE — Telephone Encounter (Signed)
Copied from CRM [id number]. Topic: Quick Communication - Rx Refill/Question >> Aug 26, 2019  1:56 PM Jaquita Rector A wrote: Medication: amphetamine-dextroamphetamine (ADDERALL XR) 20 MG 24 hr capsule   Has the patient contacted their pharmacy? Yes (Agent: If no, request that the patient contact the pharmacy for the refill.) (Agent: If yes, when and what did the pharmacy advise?)  Preferred Pharmacy (with phone number or street name): [id number] - [place], [place] - [address]  Phone:  [phone] Fax:  [phone]     Agent: Please be advised that RX refills may take up to 3 business days. We ask that you follow-up with your pharmacy.

## 2019-08-26 NOTE — Telephone Encounter (Signed)
Not due until 09/01/19

## 2019-09-09 ENCOUNTER — Other Ambulatory Visit: Payer: Self-pay

## 2019-09-09 ENCOUNTER — Ambulatory Visit (INDEPENDENT_AMBULATORY_CARE_PROVIDER_SITE_OTHER): Payer: BC Managed Care – PPO | Admitting: Nurse Practitioner

## 2019-09-09 ENCOUNTER — Encounter: Payer: Self-pay | Admitting: Nurse Practitioner

## 2019-09-09 VITALS — BP 122/79 | HR 90 | Temp 98.4°F | Ht 70.67 in | Wt 150.6 lb

## 2019-09-09 DIAGNOSIS — F411 Generalized anxiety disorder: Secondary | ICD-10-CM

## 2019-09-09 DIAGNOSIS — F909 Attention-deficit hyperactivity disorder, unspecified type: Secondary | ICD-10-CM | POA: Diagnosis not present

## 2019-09-09 DIAGNOSIS — F1290 Cannabis use, unspecified, uncomplicated: Secondary | ICD-10-CM

## 2019-09-09 NOTE — Patient Instructions (Signed)
Alcohol Abuse and Dependence Information, Adult Alcohol is a widely available drug. People drink alcohol in different amounts. People who drink alcohol very often and in large amounts often have problems during and after drinking. They may develop what is called an alcohol use disorder. There are two main types of alcohol use disorders:  Alcohol abuse. This is when you use alcohol too much or too often. You may use alcohol to make yourself feel happy or to reduce stress. You may have a hard time setting a limit on the amount you drink.  Alcohol dependence. This is when you use alcohol consistently for a period of time, and your body changes as a result. This can make it hard to stop drinking because you may start to feel sick or feel different when you do not use alcohol. These symptoms are known as withdrawal. How can alcohol abuse and dependence affect me? Alcohol abuse and dependence can have a negative effect on your life. Drinking too much can lead to addiction. You may feel like you need alcohol to function normally. You may drink alcohol before work in the morning, during the day, or as soon as you get home from work in the evening. These actions can result in:  Poor work performance.  Job loss.  Financial problems.  Car crashes or criminal charges from driving after drinking alcohol.  Problems in your relationships with friends and family.  Losing the trust and respect of coworkers, friends, and family. Drinking heavily over a long period of time can permanently damage your body and brain, and can cause lifelong health issues, such as:  Damage to your liver or pancreas.  Heart problems, high blood pressure, or stroke.  Certain cancers.  Decreased ability to fight infections.  Brain or nerve damage.  Depression.  Early (premature) death. If you are careless or you crave alcohol, it is easy to drink more than your body can handle (overdose). Alcohol overdose is a serious  situation that requires hospitalization. It may lead to permanent injuries or death. What can increase my risk?  Having a family history of alcohol abuse.  Having depression or other mental health conditions.  Beginning to drink at an early age.  Binge drinking often.  Experiencing trauma, stress, and an unstable home life during childhood.  Spending time with people who drink often. What actions can I take to prevent or manage alcohol abuse and dependence?  Do not drink alcohol if: ? Your health care provider tells you not to drink. ? You are pregnant, may be pregnant, or are planning to become pregnant.  If you drink alcohol: ? Limit how much you use to:  0-1 drink a day for women.  0-2 drinks a day for men. ? Be aware of how much alcohol is in your drink. In the [place] one drink equals one 12 oz bottle of beer (355 mL), one 5 oz glass of wine (148 mL), or one 1 oz glass of hard liquor (44 mL).  Stop drinking if you have been drinking too much. This can be very hard to do if you are used to abusing alcohol. If you begin to have withdrawal symptoms, talk with your health care provider or a person that you trust. These symptoms may include anxiety, shaky hands, headache, nausea, sweating, or not being able to sleep.  Choose to drink nonalcoholic beverages in social gatherings and places where there may be alcohol. Activity  Spend more time on activities that you enjoy that do   not involve alcohol, like hobbies or exercise.  Find healthy ways to cope with stress, such as exercise, meditation, or spending time with people you care about. General information  Talk to your family, coworkers, and friends about supporting you in your efforts to stop drinking. If they drink, ask them not to drink around you. Spend more time with people who do not drink alcohol.  If you think that you have an alcohol dependency problem: ? Tell friends or family about your concerns. ? Talk with your  health care provider or another health professional about where to get help. ? Work with a therapist and a chemical dependency counselor. ? Consider joining a support group for people who struggle with alcohol abuse and dependence. Where to find support   Your health care provider.  SMART Recovery: www.smartrecovery.org Therapy and support groups  Local treatment centers or chemical dependency counselors.  Local AA groups in your community: www.aa.org Where to find more information  Centers for Disease Control and Prevention: www.cdc.gov  National Institute on Alcohol Abuse and Alcoholism: www.niaaa.nih.gov  Alcoholics Anonymous (AA): www.aa.org Contact a health care provider if:  You drank more or for longer than you intended on more than one occasion.  You tried to stop drinking or to cut back on how much you drink, but you were not able to.  You often drink to the point of vomiting or passing out.  You want to drink so badly that you cannot think about anything else.  You have problems in your life due to drinking, but you continue to drink.  You keep drinking even though you feel anxious, depressed, or have experienced memory loss.  You have stopped doing the things you used to enjoy in order to drink.  You have to drink more than you used to in order to get the effect you want.  You experience anxiety, sweating, nausea, shakiness, and trouble sleeping when you try to stop drinking. Get help right away if:  You have thoughts about hurting yourself or others.  You have serious withdrawal symptoms, including: ? Confusion. ? Racing heart. ? High blood pressure. ? Fever. If you ever feel like you may hurt yourself or others, or have thoughts about taking your own life, get help right away. You can go to your nearest emergency department or call:  Your local emergency services (911 in the [place]  A suicide crisis helpline, such as the National Suicide Prevention  Lifeline at 1-800-273-8255. This is open 24 hours a day. Summary  Alcohol abuse and dependence can have a negative effect on your life. Drinking too much or too often can lead to addiction.  If you drink alcohol, limit how much you use.  If you are having trouble keeping your drinking under control, find ways to change your behavior. Hobbies, calming activities, exercise, or support groups can help.  If you feel you need help with changing your drinking habits, talk with your health care provider, a good friend, or a therapist, or go to an AA group. This information is not intended to replace advice given to you by your health care provider. Make sure you discuss any questions you have with your health care provider. Document Revised: 09/18/2018 Document Reviewed: 08/07/2018 Elsevier Patient Education  2020 Elsevier Inc.  

## 2019-09-09 NOTE — Assessment & Plan Note (Signed)
Chronic, ongoing with medication since 10th grade.  Continue current medication regimen, Sertraline 100 MG daily.  He denies SI/HI. Placed referral to psychiatry for further work-up due to the complexity of this case with new alcohol abuse.

## 2019-09-09 NOTE — Assessment & Plan Note (Signed)
Chronic, ongoing.  Continue current regimen, Adderall XR 20 MG, refilled 08/26/2019 for #30 with 0 refills.  Return in 3 months.  Have recommended complete cessation of MJ use, if ongoing noted on UDS may need to consider no further fills on this medication. Discussed with patient.  UDS today.  Placed referral to psychiatry for further work-up due to the complexity of this case with new alcohol abuse.

## 2019-09-09 NOTE — Progress Notes (Signed)
BP 122/79 (BP Location: Left Arm, Patient Position: Sitting, Cuff Size: Normal)   Pulse 90   Temp 98.4 F (36.9 C) (Oral)   Ht 5' 10.67" (1.795 m)   Wt 150 lb 9.6 oz (68.3 kg)   SpO2 100%   BMI 21.20 kg/m    Subjective:    Patient ID: Jason Griffin, male    DOB: June 10, 1996, 24 y.o.   MRN: [id number]  HPI: HADI DUBIN is a 24 y.o. male  Chief Complaint  Patient presents with  . ADHD   ADHD FOLLOW UP Currently take Adderall XR 20 MG daily. Annye Asa MD at [facility] pediatrics/psychiatric initially diagnosed and prescribed current medications, but aged out.  Has been on it since high school, took period off after high school, but then restarted as was not able to work well without it. Reports good control with current regimen and is able to perform well at work.Does endorse occasional marijuana use, once to twice a month.  Is using alcohol occasionally per his report, his mother had called weeks back to discuss concerns with his alcohol intake.  Discussed this with him today and alerted him that none of his personal information was provided to his mom, as she is not on DPR list, however did listen to her concerns.    His mother had reported concerns about patient drinking more heavily, including drinking out in alley while he is at work.  She stated he had some DUIs and was to do 20 hours of substance abuse training.  Verbalized concerns about his job and concerns about his mood.  At this time he denies heavy alcohol use, reporting last use on Friday.  Does endorse more anxiety and having a panic attack last week, continues on his Sertraline 100 MG daily.  Discussed at length with him concerns with current habits and that he would benefit from return to psychiatry for more intense assessment.  He agrees with this referral.   ADHD status:controlled Satisfied with current therapy:yes Medication compliance:good compliance Controlled substance contract:no Previous  psychiatry evaluation:yes Previous medications:none Taking meds on weekends/vacations:occasionally on Sunday he skips if if not working Work/school performance:good Difficulty sustaining attention/completing tasks:no Distracted by extraneous stimuli: no Does not listen when spoken to:no Fidgets with hands or feet:no Unable to stay in seat:no Blurts out/interrupts others:no ADHD Medication Side Effects:no Decreased appetite: no Headache: no Sleeping disturbance pattern: no Irritability: no Rebound effects (worse than baseline) off medication: no Anxiousness: no Dizziness: no Tics: no   Relevant past medical, surgical, family and social history reviewed and updated as indicated. Interim medical history since our last visit reviewed. Allergies and medications reviewed and updated.  Review of Systems  Constitutional: Negative for activity change, diaphoresis, fatigue and fever.  Respiratory: Negative for cough, chest tightness, shortness of breath and wheezing.   Cardiovascular: Negative for chest pain, palpitations and leg swelling.  Gastrointestinal: Negative.   Neurological: Negative.   Psychiatric/Behavioral: Positive for decreased concentration. Negative for self-injury, sleep disturbance and suicidal ideas. The patient is nervous/anxious.     Per HPI unless specifically indicated above     Objective:    BP 122/79 (BP Location: Left Arm, Patient Position: Sitting, Cuff Size: Normal)   Pulse 90   Temp 98.4 F (36.9 C) (Oral)   Ht 5' 10.67" (1.795 m)   Wt 150 lb 9.6 oz (68.3 kg)   SpO2 100%   BMI 21.20 kg/m   Wt Readings from Last 3 Encounters:  09/09/19 150 lb  9.6 oz (68.3 kg)  01/25/19 169 lb (76.7 kg)  12/01/18 160 lb (72.6 kg)    Physical Exam Vitals and nursing note reviewed.  Constitutional:      General: He is awake. He is not in acute distress.    Appearance: He is well-developed and well-groomed. He is not  ill-appearing.  HENT:     Head: Normocephalic and atraumatic.     Right Ear: Hearing normal. No drainage.     Left Ear: Hearing normal. No drainage.  Eyes:     General: Lids are normal.        Right eye: No discharge.        Left eye: No discharge.     Conjunctiva/sclera: Conjunctivae normal.     Pupils: Pupils are equal, round, and reactive to light.  Neck:     Thyroid: No thyromegaly.     Vascular: No carotid bruit.     Trachea: Trachea normal.  Cardiovascular:     Rate and Rhythm: Normal rate and regular rhythm.     Heart sounds: Normal heart sounds, S1 normal and S2 normal. No murmur. No gallop.   Pulmonary:     Effort: Pulmonary effort is normal. No accessory muscle usage or respiratory distress.     Breath sounds: Normal breath sounds.  Abdominal:     General: Bowel sounds are normal.     Palpations: Abdomen is soft.  Musculoskeletal:        General: Normal range of motion.     Cervical back: Normal range of motion and neck supple.     Right lower leg: No edema.     Left lower leg: No edema.  Skin:    General: Skin is warm and dry.     Comments: Nails bitten and abrasions on knuckles bilaterally, healing.  Neurological:     Mental Status: He is alert and oriented to person, place, and time.  Psychiatric:        Attention and Perception: Attention normal.        Mood and Affect: Mood is anxious.        Speech: Speech normal.        Behavior: Behavior normal. Behavior is cooperative.        Thought Content: Thought content normal.     Results for orders placed or performed in visit on 01/25/19  Urine drugs of abuse scrn w alc, routine (Ref Lab)  Result Value Ref Range   Amphetamines, Urine See Final Results Cutoff=1000 ng/mL   Barbiturate Quant, Ur Negative Cutoff=300 ng/mL   Benzodiazepine Quant, Ur Negative Cutoff=300 ng/mL   Cannabinoid Quant, Ur See Final Results Cutoff=50 ng/mL   Cocaine (Metab.) Negative Cutoff=300 ng/mL   Opiate Quant, Ur Negative  Cutoff=300 ng/mL   PCP Quant, Ur Negative Cutoff=25 ng/mL   Methadone Screen, Urine Negative Cutoff=300 ng/mL   Propoxyphene Negative Cutoff=300 ng/mL   Ethanol, Urine Negative Cutoff=0.020 %  Amphetamine Conf, Ur  Result Value Ref Range   Amphetamines Positive (A) Cutoff=1000   Amphetamine Positive (A)    Amphetamine GC/MS Conf 3,750 Cutoff=500 ng/mL   Methamphetamine Negative Cutoff=500  Panel [id number]  Result Value Ref Range   Cannabinoid GC/MS, Ur Positive (A) Cutoff=50   CARBOXY THC GC/MS CONF 189 Cutoff=10 ng/mL      Assessment & Plan:   Problem List Items Addressed This Visit      Other   GAD (generalized anxiety disorder) - Primary    Chronic, ongoing with medication since 10th grade.  Continue current medication regimen, Sertraline 100 MG daily.  He denies SI/HI. Placed referral to psychiatry for further work-up due to the complexity of this case with new alcohol abuse.      Relevant Orders   Ambulatory referral to Psychiatry   ADHD (attention deficit hyperactivity disorder)    Chronic, ongoing.  Continue current regimen, Adderall XR 20 MG, refilled 08/26/2019 for #30 with 0 refills.  Return in 3 months.  Have recommended complete cessation of MJ use, if ongoing noted on UDS may need to consider no further fills on this medication. Discussed with patient.  UDS today.  Placed referral to psychiatry for further work-up due to the complexity of this case with new alcohol abuse.      Relevant Orders   [id number] 11+Oxyco+Alc+Crt-Bund   Ambulatory referral to Psychiatry   Marijuana use    Recommend complete cessation of MJ use, if ongoing noted on future UDS may need to consider no further refills on Adderall.          Follow up plan: Return in about 3 months (around 12/10/2019) for Mood and ADHD + substance use.

## 2019-09-09 NOTE — Assessment & Plan Note (Signed)
Recommend complete cessation of MJ use, if ongoing noted on future UDS may need to consider no further refills on Adderall.

## 2019-09-10 ENCOUNTER — Telehealth: Payer: Self-pay | Admitting: Nurse Practitioner

## 2019-09-10 NOTE — Telephone Encounter (Signed)
Spoke to his mom, Blakeley Margraf, on phone.  She is aware that no personal information can be released due to her not being on patient DPR list.  She did bring patient to appointment yesterday and is aware that he had to do drug screen, made her aware this is protocol for patients on any type of controlled substance.  She reported relief with that as she thought it meant he was doing oxycodone or other drugs.  She is aware that a referral was placed to psychiatry.  She continues to express concerns about his drinking and mood.  On last conversation PCP had directed her to [facility] for emergent concerns, as they have walk in, she did not take him.  Advised her to take him there this week if she is concerned and would like him seen this week by psychiatry . Also advised her to take him immediately to ER if he expressed any SI or HI.  He denied these emotions at visit yesterday.  She does report he expresses feeling weak, but no SI thoughts.  She stated appreciation for call and understanding that major details, past what her son has shared with her, can not be discussed with her by PCP due to privacy act.

## 2019-09-10 NOTE — Telephone Encounter (Signed)
Copied from CRM [id number]. Topic: General - Inquiry >> Sep 10, 2019  9:33 AM Reggie Pile, NT wrote: Reason for CRM: Patient's family member called in stating she would like a call from [facility] as she has a question she would like to discuss with her. Family member did not give any information. Please advise >> Sep 10, 2019  9:53 AM Adela Ports M wrote: Jason Griffin and she stated that she found his AVS and was concerned about some of the things she saw. I advised her that she is not on the DPR. She is concerned that he has been using opioids and drinking and she doesn't know what to do. She would just like guidance as a parent how to handle this situation. She stated that she found a piece of paper in his room with his feelings over the last weeks and she would like to fill [name] in on this. I again advised her that I couldn't release anything that was spoken about in the appointment but I would listen and send the information to the provider.

## 2019-09-13 ENCOUNTER — Telehealth: Payer: Self-pay | Admitting: Nurse Practitioner

## 2019-09-13 LAB — DRUG PROFILE 799016
Amphetamine GC/MS Conf: 5820 ng/mL
Amphetamine: POSITIVE — AB
Amphetamines: POSITIVE — AB
Methamphetamine: NEGATIVE

## 2019-09-13 LAB — DRUG SCREEN 764883 11+OXYCO+ALC+CRT-BUND
BENZODIAZ UR QL: NEGATIVE ng/mL
Barbiturate: NEGATIVE ng/mL
Creatinine: 278.7 mg/dL (ref 20.0–300.0)
Ethanol: NEGATIVE %
Meperidine: NEGATIVE ng/mL
Methadone Screen, Urine: NEGATIVE ng/mL
OPIATE SCREEN URINE: NEGATIVE ng/mL
Oxycodone/Oxymorphone, Urine: NEGATIVE ng/mL
Phencyclidine: NEGATIVE ng/mL
Propoxyphene: NEGATIVE ng/mL
Tramadol: NEGATIVE ng/mL
pH, Urine: 6 (ref 4.5–8.9)

## 2019-09-13 LAB — CANNABINOID CONFIRMATION, UR
CANNABINOIDS: POSITIVE — AB
Carboxy THC GC/MS Conf: >300 ng/mL

## 2019-09-13 LAB — COCAINE CONF, UR
Benzoylecgonine GC/MS Conf: 1182 ng/mL
Cocaine Metab Quant, Ur: POSITIVE — AB

## 2019-09-13 NOTE — Telephone Encounter (Signed)
Spoke to patient on telephone.  Reviewed drug screen with him noting Amphetamine, expected with his Adderral, and MJ (he does smoke this on occasion), but it also returned noting cocaine.  He denies cocaine use.  We discussed possibility of MJ being laced, he did have a panic attack last week on Friday, had not had one in a long while and this was unexpected.  He had smoked MJ awhile before this attack.  Discussed with him to cut back on MJ use due to possibility of issues with supply and educated him on effects on cocaine.  Also discussed with him to ensure he follows up with psychiatry, he reports not having heard from them yet.  Recommended him and his mom go to [facility] either today or Monday.  At this time he reports feeling fine and denies SI/HI.  Did recommend need for psychiatric care going forward and would need negative drug screens to continue on treatment.

## 2019-09-16 NOTE — Telephone Encounter (Signed)
Messaged referral coordinators to f/up on referral.

## 2019-09-16 NOTE — Telephone Encounter (Signed)
Message received from [place] with referrals. States she contacted [employer] and the patient needed to call and schedule. States she called and gave the patient this information.

## 2019-11-04 ENCOUNTER — Other Ambulatory Visit: Payer: Self-pay | Admitting: Nurse Practitioner

## 2019-11-04 MED ORDER — AMPHETAMINE-DEXTROAMPHET ER 20 MG PO CP24: 20.0000 mg | ORAL_CAPSULE | Freq: Every day | ORAL | 0 refills | Status: DC

## 2019-11-04 NOTE — Telephone Encounter (Signed)
This medication still can not be filled correct, he has to see psych?

## 2019-11-04 NOTE — Telephone Encounter (Signed)
Requested medication (s) are due for refill today yes  Requested medication (s) are on the active medication list -yes  Future visit scheduled -no  Last refill: 09/01/19  Notes to clinic: Request for non delegated Rx  Requested Prescriptions  Pending Prescriptions Disp Refills   amphetamine-dextroamphetamine (ADDERALL XR) 20 MG 24 hr capsule 30 capsule 0    Sig: Take 1 capsule (20 mg total) by mouth daily.      Not Delegated - Psychiatry:  Stimulants/ADHD Failed - 11/04/2019 10:14 AM      Failed - This refill cannot be delegated      Passed - Urine Drug Screen completed in last 360 days.      Passed - Valid encounter within last 3 months    Recent Outpatient Visits           1 month ago GAD (generalized anxiety disorder)   [facility] Cannady, Corrie Dandy T, NP   4 months ago Attention deficit hyperactivity disorder (ADHD), unspecified ADHD type   [facility] [place], [place]ton T, NP   9 months ago Attention deficit hyperactivity disorder (ADHD), unspecified ADHD type   [facility] [place], [name] T, NP   11 months ago Attention deficit hyperactivity disorder (ADHD), unspecified ADHD type   [facility] [place], Corrie Dandy T, NP                  Requested Prescriptions  Pending Prescriptions Disp Refills   amphetamine-dextroamphetamine (ADDERALL XR) 20 MG 24 hr capsule 30 capsule 0    Sig: Take 1 capsule (20 mg total) by mouth daily.      Not Delegated - Psychiatry:  Stimulants/ADHD Failed - 11/04/2019 10:14 AM      Failed - This refill cannot be delegated      Passed - Urine Drug Screen completed in last 360 days.      Passed - Valid encounter within last 3 months    Recent Outpatient Visits           1 month ago GAD (generalized anxiety disorder)   [facility] Cann[place], Dorie Rank, NP   4 months ago Attention deficit hyperactivity disorder (ADHD), unspecified ADHD type   [facility] [place],  Dorie Rank, NP   9 months ago Attention deficit hyperactivity disorder (ADHD), unspecified ADHD type   [facility], Dorie Rank, NP   11 months ago Attention deficit hyperactivity disorder (ADHD), unspecified ADHD type   [facility] [place], Dorie Rank, NP

## 2019-11-04 NOTE — Telephone Encounter (Signed)
Patient states that he will contact [facility].

## 2019-11-04 NOTE — Telephone Encounter (Signed)
Called and left a message for patient to return my call.  

## 2019-11-04 NOTE — Telephone Encounter (Signed)
Did you send the month supply in?

## 2019-11-04 NOTE — Telephone Encounter (Signed)
Patient is calling back for Jason Griffin. Patient states that he has not been able to make appt at [facility] due to his work schedule. Please advise Cb- [phone]

## 2019-11-04 NOTE — Telephone Encounter (Signed)
Routing to provider  

## 2019-11-04 NOTE — Telephone Encounter (Signed)
Medication Refill - Medication: amphetamine-dextroamphetamine (ADDERALL XR) 20 MG 24 hr capsule    Preferred Pharmacy (with phone number or street name):  [id number] - [place], [place] - [address] Phone:  [phone]  Fax:  [phone]       Agent: Please be advised that RX refills may take up to 3 business days. We ask that you follow-up with your pharmacy.

## 2019-11-04 NOTE — Telephone Encounter (Signed)
He is to be seeing psychiatry for further fills on this.  He recently broke contract that was dated 10/140/20 due to recently cocaine on UDS.  He is aware of need for psychiatry and should have established.  Please check on this and alert him of this need.

## 2019-11-04 NOTE — Telephone Encounter (Signed)
If he is completely out I will provide one fill only, but from here on he needs to ensure he sees psychiatry.  He can either do that at location of referral, this is in chart and believe it was [facility], or go to [facility] and establish care this week.  Please let me know what he says.

## 2019-11-04 NOTE — Progress Notes (Signed)
Last Adderall fill 09/28/19.  Patient is aware this is last fill of this from PCP, needs to attend psychiatry for further due to underlying substance abuse would benefit from more intense psychiatric evaluation and care + failed UDS on recent visit, breaking controlled subs contract.  He is aware of this.  30 day supply only of medication sent at this time with no refills.

## 2019-11-20 ENCOUNTER — Other Ambulatory Visit: Payer: Self-pay | Admitting: Nurse Practitioner

## 2019-11-20 NOTE — Telephone Encounter (Signed)
Requested Prescriptions  Pending Prescriptions Disp Refills   sertraline (ZOLOFT) 100 MG tablet [Pharmacy Med Name: SERTRALINE HCL 100 MG TABLET] 90 tablet 2    Sig: TAKE 1 TABLET BY MOUTH EVERY DAY     Psychiatry:  Antidepressants - SSRI Passed - 11/20/2019  1:21 AM      Passed - Valid encounter within last 6 months    Recent Outpatient Visits          2 months ago GAD (generalized anxiety disorder)   [facility] Cannady, [name] T, NP   5 months ago Attention deficit hyperactivity disorder (ADHD), unspecified ADHD type   [facility] [place], Dorie Rank, NP   9 months ago Attention deficit hyperactivity disorder (ADHD), unspecified ADHD type   [facility] [place], Dorie Rank, NP   1 year ago Attention deficit hyperactivity disorder (ADHD), unspecified ADHD type   [facility] [place], Dorie Rank, NP

## 2020-03-10 ENCOUNTER — Other Ambulatory Visit: Payer: BC Managed Care – PPO

## 2020-03-12 ENCOUNTER — Telehealth (INDEPENDENT_AMBULATORY_CARE_PROVIDER_SITE_OTHER): Payer: BC Managed Care – PPO | Admitting: Family Medicine

## 2020-03-12 ENCOUNTER — Encounter: Payer: Self-pay | Admitting: Family Medicine

## 2020-03-12 VITALS — Temp 96.0°F

## 2020-03-12 DIAGNOSIS — U071 COVID-19: Secondary | ICD-10-CM | POA: Diagnosis not present

## 2020-03-12 MED ORDER — HYDROCOD POLST-CPM POLST ER 10-8 MG/5ML PO SUER: 5.0000 mL | Freq: Two times a day (BID) | ORAL | 0 refills | Status: DC | PRN

## 2020-03-12 MED ORDER — BENZONATATE 200 MG PO CAPS: 200.0000 mg | ORAL_CAPSULE | Freq: Two times a day (BID) | ORAL | 0 refills | Status: DC | PRN

## 2020-03-12 NOTE — Progress Notes (Signed)
Temp (!) 96 F (35.6 C) (Oral)    Subjective:    Patient ID: Jason Griffin, male    DOB: December 22, 1995, 24 y.o.   MRN: [id number]  HPI: Jason Griffin is a 24 y.o. male  Chief Complaint  Patient presents with  . Covid Exposure    pt stated has had a positive covid test this past Tuesday   UPPER RESPIRATORY TRACT INFECTION- tested positive on Tuesday Duration: saturday Worst symptom: body aches Fever: no Cough: yes Shortness of breath: no Wheezing: no Chest pain: no Chest tightness: no Chest congestion: no Nasal congestion: no Runny nose: no Post nasal drip: no Sneezing: no Sore throat: no Swollen glands: no Sinus pressure: no Headache: yes Face pain: no Toothache: no Ear pain: no  Ear pressure: no  Eyes red/itching:no Eye drainage/crusting: no  Vomiting: no Rash: no Fatigue: yes Sick contacts: no Strep contacts: no  Context: stable Recurrent sinusitis: no Relief with OTC cold/cough medications: no  Treatments attempted: none   Relevant past medical, surgical, family and social history reviewed and updated as indicated. Interim medical history since our last visit reviewed. Allergies and medications reviewed and updated.  Review of Systems  Constitutional: Positive for fatigue. Negative for activity change, appetite change, chills, diaphoresis, fever and unexpected weight change.  HENT: Positive for sore throat. Negative for congestion, dental problem, drooling, ear discharge, ear pain, facial swelling, hearing loss, mouth sores, nosebleeds, postnasal drip, rhinorrhea, sinus pressure, sinus pain, sneezing, tinnitus, trouble swallowing and voice change.   Respiratory: Positive for cough. Negative for apnea, choking, chest tightness, shortness of breath, wheezing and stridor.   Cardiovascular: Negative.   Gastrointestinal: Negative.   Psychiatric/Behavioral: Negative.     Per HPI unless specifically indicated above     Objective:    Temp (!) 96  F (35.6 C) (Oral)   Wt Readings from Last 3 Encounters:  09/09/19 150 lb 9.6 oz (68.3 kg)  01/25/19 169 lb (76.7 kg)  12/01/18 160 lb (72.6 kg)    Physical Exam Vitals and nursing note reviewed.  Constitutional:      General: He is not in acute distress.    Appearance: Normal appearance. He is not ill-appearing, toxic-appearing or diaphoretic.  HENT:     Head: Normocephalic and atraumatic.     Right Ear: External ear normal.     Left Ear: External ear normal.     Nose: Nose normal.     Mouth/Throat:     Mouth: Mucous membranes are moist.     Pharynx: Oropharynx is clear.  Eyes:     General: No scleral icterus.       Right eye: No discharge.        Left eye: No discharge.     Conjunctiva/sclera: Conjunctivae normal.     Pupils: Pupils are equal, round, and reactive to light.  Pulmonary:     Effort: Pulmonary effort is normal. No respiratory distress.     Comments: Speaking in full sentences Musculoskeletal:        General: Normal range of motion.     Cervical back: Normal range of motion.  Skin:    Coloration: Skin is not jaundiced or pale.     Findings: No bruising, erythema, lesion or rash.  Neurological:     Mental Status: He is alert and oriented to person, place, and time. Mental status is at baseline.  Psychiatric:        Mood and Affect: Mood normal.  Behavior: Behavior normal.        Thought Content: Thought content normal.        Judgment: Judgment normal.     Results for orders placed or performed in visit on 09/09/19  [id number] 11+Oxyco+Alc+Crt-Bund  Result Value Ref Range   Ethanol Negative Cutoff=0.020 %   Amphetamines, Urine See Final Results Cutoff=1000 ng/mL   Barbiturate Negative Cutoff=200 ng/mL   BENZODIAZ UR QL Negative Cutoff=200 ng/mL   Cannabinoid Quant, Ur See Final Results Cutoff=50 ng/mL   Cocaine (Metabolite) See Final Results Cutoff=300 ng/mL   OPIATE SCREEN URINE Negative Cutoff=300 ng/mL   Oxycodone/Oxymorphone, Urine Negative  Cutoff=300 ng/mL   Phencyclidine Negative Cutoff=25 ng/mL   Methadone Screen, Urine Negative Cutoff=300 ng/mL   Propoxyphene Negative Cutoff=300 ng/mL   Meperidine Negative Cutoff=200 ng/mL   Tramadol Negative Cutoff=200 ng/mL   Creatinine 278.7 20.0 - 300.0 mg/dL   pH, Urine 6.0 4.5 - 8.9  Drug Profile [phone]  Result Value Ref Range   Amphetamines Positive (A) Cutoff=1000   Amphetamine Positive (A)    Amphetamine GC/MS Conf 5,820 Cutoff=500 ng/mL   Methamphetamine Negative Cutoff=500  Cannabinoid Conf, Ur  Result Value Ref Range   CANNABINOIDS Positive (A) Cutoff=50   Carboxy THC GC/MS Conf >300 Cutoff=15 ng/mL  Cocaine Con, Ur  Result Value Ref Range   Cocaine Metab Quant, Ur Positive (A) Cutoff=300   Benzoylecgonine GC/MS Conf 1,182 Cutoff=150 ng/mL      Assessment & Plan:   Problem List Items Addressed This Visit    None    Visit Diagnoses    COVID-19    -  Primary   Will treat with tussionex and tessalon for comfort. Call with any concerns or if getting worse. Continue to monitor.        Follow up plan: Return if symptoms worsen or fail to improve.    . This visit was completed via MyChart due to the restrictions of the COVID-19 pandemic. All issues as above were discussed and addressed. Physical exam was done as above through visual confirmation on MyChart. If it was felt that the patient should be evaluated in the office, they were directed there. The patient verbally consented to this visit. . Location of the patient: home . Location of the provider: work . Those involved with this call:  . Provider: Olevia Perches, DO . CMA: Elton Sin, CMA . Front Desk/Registration: Adela Ports  . Time spent on call: 15 minutes with patient face to face via video conference. More than 50% of this time was spent in counseling and coordination of care. 23 minutes total spent in review of patient's record and preparation of their chart.

## 2020-12-16 ENCOUNTER — Encounter: Payer: Self-pay | Admitting: Nurse Practitioner

## 2020-12-16 ENCOUNTER — Other Ambulatory Visit: Payer: Self-pay

## 2020-12-16 ENCOUNTER — Ambulatory Visit: Payer: BC Managed Care – PPO | Admitting: Nurse Practitioner

## 2020-12-16 VITALS — BP 136/87 | HR 98 | Temp 98.5°F | Wt 171.4 lb

## 2020-12-16 DIAGNOSIS — M5442 Lumbago with sciatica, left side: Secondary | ICD-10-CM | POA: Insufficient documentation

## 2020-12-16 DIAGNOSIS — M549 Dorsalgia, unspecified: Secondary | ICD-10-CM | POA: Insufficient documentation

## 2020-12-16 MED ORDER — PREDNISONE 10 MG PO TABS: ORAL_TABLET | ORAL | 0 refills | Status: DC

## 2020-12-16 MED ORDER — METHOCARBAMOL 500 MG PO TABS: 500.0000 mg | ORAL_TABLET | Freq: Three times a day (TID) | ORAL | 2 refills | Status: DC | PRN

## 2020-12-16 NOTE — Assessment & Plan Note (Signed)
Acute x 2 months.  Started after fall playing ball.  At this time will obtain imaging lumbar spine to further assess and educated him on where to obtain.  Trial Prednisone taper and Robaxin PRN for pain.  Recommend taking Tylenol and Ibuprofen as needed for discomfort + using heating pad.  Discussed gentle stretches at home to assist sciatic pain.  Will provide work excuse for one week, return next Tuesday, as does lot of heavy lifting at work which is most likely agitating pain.  Recommend complete rest from heavy lifting at this time.  Return in one week.

## 2020-12-16 NOTE — Progress Notes (Signed)
BP 136/87   Pulse 98   Temp 98.5 F (36.9 C) (Oral)   Wt 171 lb 6.4 oz (77.7 kg)   SpO2 99%   BMI 24.13 kg/m    Subjective:    Patient ID: Jason Griffin, male    DOB: 09-Jun-1996, 25 y.o.   MRN: [id number]  HPI: Jason Griffin is a 25 y.o. male  Chief Complaint  Patient presents with   Back Pain    Patient states he has been having this pain for a few months now and states he was playing ball and tripped over a ball and he noticed his body twisted an unusual way. Patient notice it has had gotten worse due to him working at [employer] and notice that it now radiates down his left leg. Patient describes it as a sharp and shooting pain. Patient states the pain use to be a come and go, now it is constant and patient is concerned.    BACK PAIN Was playing ball and twisted in a strange way two months ago. Since that time has been having more pain and is radiating down left leg.   Duration: weeks Mechanism of injury:  fall Location: Left and low back Onset: gradual Severity: 8/10 worst and at present 0/10 Quality: sharp, aching, shooting, and throbbing Frequency: intermittent -- now becoming more constant Radiation: L leg below the knee Aggravating factors: lifting, movement, and bending Alleviating factors:  pillow propping in bed Status: fluctuating Treatments attempted: APAP and ibuprofen  Relief with NSAIDs?: moderate Nighttime pain:  yes Paresthesias / decreased sensation:  no Bowel / bladder incontinence:  no Fevers:  no Dysuria / urinary frequency:  no   Relevant past medical, surgical, family and social history reviewed and updated as indicated. Interim medical history since our last visit reviewed. Allergies and medications reviewed and updated.  Review of Systems  Constitutional:  Negative for activity change, diaphoresis, fatigue and fever.  Respiratory:  Negative for cough, chest tightness, shortness of breath and wheezing.   Cardiovascular:  Negative for  chest pain, palpitations and leg swelling.  Gastrointestinal: Negative.   Musculoskeletal:  Positive for back pain.  Neurological: Negative.   Psychiatric/Behavioral: Negative.     Per HPI unless specifically indicated above     Objective:    BP 136/87   Pulse 98   Temp 98.5 F (36.9 C) (Oral)   Wt 171 lb 6.4 oz (77.7 kg)   SpO2 99%   BMI 24.13 kg/m   Wt Readings from Last 3 Encounters:  12/16/20 171 lb 6.4 oz (77.7 kg)  09/09/19 150 lb 9.6 oz (68.3 kg)  01/25/19 169 lb (76.7 kg)    Physical Exam Vitals and nursing note reviewed.  Constitutional:      General: He is awake. He is not in acute distress.    Appearance: He is well-developed and well-groomed. He is not ill-appearing or toxic-appearing.  HENT:     Head: Normocephalic and atraumatic.     Right Ear: Hearing normal. No drainage.     Left Ear: Hearing normal. No drainage.  Eyes:     General: Lids are normal.        Right eye: No discharge.        Left eye: No discharge.     Conjunctiva/sclera: Conjunctivae normal.     Pupils: Pupils are equal, round, and reactive to light.  Neck:     Thyroid: No thyromegaly.     Vascular: No carotid bruit.  Cardiovascular:  Rate and Rhythm: Normal rate and regular rhythm.     Heart sounds: Normal heart sounds, S1 normal and S2 normal. No murmur heard.   No gallop.  Pulmonary:     Effort: Pulmonary effort is normal. No accessory muscle usage or respiratory distress.     Breath sounds: Normal breath sounds.  Abdominal:     General: Bowel sounds are normal.     Palpations: Abdomen is soft.  Musculoskeletal:     Cervical back: Normal range of motion and neck supple.     Lumbar back: Tenderness (left side) present. No swelling, edema, signs of trauma, spasms or bony tenderness. Decreased range of motion. Positive left straight leg raise test. Negative right straight leg raise test. Scoliosis (mild) present.     Right lower leg: No edema.     Left lower leg: No edema.   Skin:    General: Skin is warm and dry.     Capillary Refill: Capillary refill takes less than 2 seconds.     Findings: No rash.  Neurological:     Mental Status: He is alert and oriented to person, place, and time.     Deep Tendon Reflexes: Reflexes are normal and symmetric.  Psychiatric:        Attention and Perception: Attention normal.        Mood and Affect: Mood normal.        Speech: Speech normal.        Behavior: Behavior normal. Behavior is cooperative.        Thought Content: Thought content normal.    Results for orders placed or performed in visit on 09/09/19  [id number] 11+Oxyco+Alc+Crt-Bund  Result Value Ref Range   Ethanol Negative Cutoff=0.020 %   Amphetamines, Urine See Final Results Cutoff=1000 ng/mL   Barbiturate Negative Cutoff=200 ng/mL   BENZODIAZ UR QL Negative Cutoff=200 ng/mL   Cannabinoid Quant, Ur See Final Results Cutoff=50 ng/mL   Cocaine (Metabolite) See Final Results Cutoff=300 ng/mL   OPIATE SCREEN URINE Negative Cutoff=300 ng/mL   Oxycodone/Oxymorphone, Urine Negative Cutoff=300 ng/mL   Phencyclidine Negative Cutoff=25 ng/mL   Methadone Screen, Urine Negative Cutoff=300 ng/mL   Propoxyphene Negative Cutoff=300 ng/mL   Meperidine Negative Cutoff=200 ng/mL   Tramadol Negative Cutoff=200 ng/mL   Creatinine 278.7 20.0 - 300.0 mg/dL   pH, Urine 6.0 4.5 - 8.9  Drug Profile [id number]  Result Value Ref Range   Amphetamines Positive (A) Cutoff=1000   Amphetamine Positive (A)    Amphetamine GC/MS Conf 5,820 Cutoff=500 ng/mL   Methamphetamine Negative Cutoff=500  Cannabinoid Conf, Ur  Result Value Ref Range   CANNABINOIDS Positive (A) Cutoff=50   Carboxy THC GC/MS Conf >300 Cutoff=15 ng/mL  Cocaine Con, Ur  Result Value Ref Range   Cocaine Metab Quant, Ur Positive (A) Cutoff=300   Benzoylecgonine GC/MS Conf 1,182 Cutoff=150 ng/mL      Assessment & Plan:   Problem List Items Addressed This Visit       Nervous and Auditory   Acute left-sided low  back pain with left-sided sciatica - Primary    Acute x 2 months.  Started after fall playing ball.  At this time will obtain imaging lumbar spine to further assess and educated him on where to obtain.  Trial Prednisone taper and Robaxin PRN for pain.  Recommend taking Tylenol and Ibuprofen as needed for discomfort + using heating pad.  Discussed gentle stretches at home to assist sciatic pain.  Will provide work excuse for one week, return next Tuesday,  as does lot of heavy lifting at work which is most likely agitating pain.  Recommend complete rest from heavy lifting at this time.  Return in one week.       Relevant Medications   predniSONE (DELTASONE) 10 MG tablet   methocarbamol (ROBAXIN) 500 MG tablet   Other Relevant Orders   DG Lumbar Spine Complete     Follow up plan: Return in about 1 week (around 12/23/2020) for Back Pain.

## 2020-12-16 NOTE — Patient Instructions (Signed)
Sciatica Rehab °Ask your health care provider which exercises are safe for you. Do exercises exactly as told by your health care provider and adjust them as directed. It is normal to feel mild stretching, pulling, tightness, or discomfort as you do these exercises. Stop right away if you feel sudden pain or your pain gets worse. Do not begin these exercises until told by your health care provider. °Stretching and range-of-motion exercises °These exercises warm up your muscles and joints and improve the movement and flexibility of your hips and back. These exercises also help to relieve pain, numbness, and tingling. °Sciatic nerve glide °Sit in a chair with your head facing down toward your chest. Place your hands behind your back. Let your shoulders slump forward. °Slowly straighten one of your legs while you tilt your head back as if you are looking toward the ceiling. Only straighten your leg as far as you can without making your symptoms worse. °Hold this position for __________ seconds. °Slowly return to the starting position. °Repeat with your other leg. °Repeat __________ times. Complete this exercise __________ times a day. °Knee to chest with hip adduction and internal rotation ° °Lie on your back on a firm surface with both legs straight. °Bend one of your knees and move it up toward your chest until you feel a gentle stretch in your lower back and buttock. Then, move your knee toward the shoulder that is on the opposite side from your leg. This is hip adduction and internal rotation. °Hold your leg in this position by holding on to the front of your knee. °Hold this position for __________ seconds. °Slowly return to the starting position. °Repeat with your other leg. °Repeat __________ times. Complete this exercise __________ times a day. °Prone extension on elbows ° °Lie on your abdomen on a firm surface. A bed may be too soft for this exercise. °Prop yourself up on your elbows. °Use your arms to help  lift your chest up until you feel a gentle stretch in your abdomen and your lower back. °This will place some of your body weight on your elbows. If this is uncomfortable, try stacking pillows under your chest. °Your hips should stay down, against the surface that you are lying on. Keep your hip and back muscles relaxed. °Hold this position for __________ seconds. °Slowly relax your upper body and return to the starting position. °Repeat __________ times. Complete this exercise __________ times a day. °Strengthening exercises °These exercises build strength and endurance in your back. Endurance is the ability to use your muscles for a long time, even after they get tired. °Pelvic tilt °This exercise strengthens the muscles that lie deep in the abdomen. °Lie on your back on a firm surface. Bend your knees and keep your feet flat on the floor. °Tense your abdominal muscles. Tip your pelvis up toward the ceiling and flatten your lower back into the floor. °To help with this exercise, you may place a small towel under your lower back and try to push your back into the towel. °Hold this position for __________ seconds. °Let your muscles relax completely before you repeat this exercise. °Repeat __________ times. Complete this exercise __________ times a day. °Alternating arm and leg raises ° °Get on your hands and knees on a firm surface. If you are on a hard floor, you may want to use padding, such as an exercise mat, to cushion your knees. °Line up your arms and legs. Your hands should be directly below your shoulders,   and your knees should be directly below your hips. °Lift your left leg behind you. At the same time, raise your right arm and straighten it in front of you. °Do not lift your leg higher than your hip. °Do not lift your arm higher than your shoulder. °Keep your abdominal and back muscles tight. °Keep your hips facing the ground. °Do not arch your back. °Keep your balance carefully, and do not hold your  breath. °Hold this position for __________ seconds. °Slowly return to the starting position. °Repeat with your right leg and your left arm. °Repeat __________ times. Complete this exercise __________ times a day. °Posture and body mechanics °Good posture and healthy body mechanics can help to relieve stress in your body's tissues and joints. Body mechanics refers to the movements and positions of your body while you do your daily activities. Posture is part of body mechanics. Good posture means: °Your spine is in its natural S-curve position (neutral). °Your shoulders are pulled back slightly. °Your head is not tipped forward. °Follow these guidelines to improve your posture and body mechanics in your everyday activities. °Standing ° °When standing, keep your spine neutral and your feet about hip width apart. Keep a slight bend in your knees. Your ears, shoulders, and hips should line up. °When you do a task in which you stand in one place for a long time, place one foot up on a stable object that is 2-4 inches (5-10 cm) high, such as a footstool. This helps keep your spine neutral. °Sitting ° °When sitting, keep your spine neutral and keep your feet flat on the floor. Use a footrest, if necessary, and keep your thighs parallel to the floor. Avoid rounding your shoulders, and avoid tilting your head forward. °When working at a desk or a computer, keep your desk at a height where your hands are slightly lower than your elbows. Slide your chair under your desk so you are close enough to maintain good posture. °When working at a computer, place your monitor at a height where you are looking straight ahead and you do not have to tilt your head forward or downward to look at the screen. °Resting °When lying down and resting, avoid positions that are most painful for you. °If you have pain with activities such as sitting, bending, stooping, or squatting, lie in a position in which your body does not bend very much. For  example, avoid curling up on your side with your arms and knees near your chest (fetal position). °If you have pain with activities such as standing for a long time or reaching with your arms, lie with your spine in a neutral position and bend your knees slightly. Try the following positions: °Lying on your side with a pillow between your knees. °Lying on your back with a pillow under your knees. °Lifting ° °When lifting objects, keep your feet at least shoulder width apart and tighten your abdominal muscles. °Bend your knees and hips and keep your spine neutral. It is important to lift using the strength of your legs, not your back. Do not lock your knees straight out. °Always ask for help to lift heavy or awkward objects. °This information is not intended to replace advice given to you by your health care provider. Make sure you discuss any questions you have with your health care provider. °Document Revised: 09/21/2018 Document Reviewed: 06/21/2018 °Elsevier Patient Education © 2022 [employer] ° °

## 2020-12-24 ENCOUNTER — Other Ambulatory Visit: Payer: Self-pay

## 2020-12-24 ENCOUNTER — Ambulatory Visit: Payer: BC Managed Care – PPO | Admitting: Nurse Practitioner

## 2020-12-24 ENCOUNTER — Encounter: Payer: Self-pay | Admitting: Nurse Practitioner

## 2020-12-24 VITALS — BP 114/75 | HR 74 | Temp 97.9°F | Wt 178.6 lb

## 2020-12-24 DIAGNOSIS — M5442 Lumbago with sciatica, left side: Secondary | ICD-10-CM

## 2020-12-24 NOTE — Progress Notes (Signed)
BP 114/75   Pulse 74   Temp 97.9 F (36.6 C) (Oral)   Wt 178 lb 9.6 oz (81 kg)   SpO2 99%   BMI 25.14 kg/m    Subjective:    Patient ID: Jason Griffin, male    DOB: 29-Dec-1995, 25 y.o.   MRN: [id number]  HPI: Jason Griffin is a 25 y.o. male  Chief Complaint  Patient presents with   Back Pain    Patient states the medication was helping him, but he still notices a sharp pain that comes and goes, but when he is working he notices it is constant. Patient states he wears a back brace to try and help with the discomfort. Patient is concerned about the discomfort in his leg.    BACK PAIN Follow-up for back pain -- takes Robaxin as needed.  Sent a Prednisone taper.  Reports some pain coming and going.  Notices it more constant at work when moving.  Wearing back brace.  Has not gone for x-ray yet.   Duration: weeks Mechanism of injury:  fall Location: Left and low back Onset: gradual Severity: 8/10 worst and at present 0/10 Quality: sharp, aching, shooting, and throbbing Frequency: intermittent -- now becoming more constant Radiation: L leg below the knee Aggravating factors: lifting, movement, and bending Alleviating factors:  pillow propping in bed Status: fluctuating Treatments attempted: APAP and ibuprofen  Relief with NSAIDs?: moderate Nighttime pain:  yes Paresthesias / decreased sensation:  no Bowel / bladder incontinence:  no Fevers:  no Dysuria / urinary frequency:  no   Relevant past medical, surgical, family and social history reviewed and updated as indicated. Interim medical history since our last visit reviewed. Allergies and medications reviewed and updated.  Review of Systems  Constitutional:  Negative for activity change, diaphoresis, fatigue and fever.  Respiratory:  Negative for cough, chest tightness, shortness of breath and wheezing.   Cardiovascular:  Negative for chest pain, palpitations and leg swelling.  Gastrointestinal: Negative.    Musculoskeletal:  Positive for back pain.  Neurological: Negative.   Psychiatric/Behavioral: Negative.     Per HPI unless specifically indicated above     Objective:    BP 114/75   Pulse 74   Temp 97.9 F (36.6 C) (Oral)   Wt 178 lb 9.6 oz (81 kg)   SpO2 99%   BMI 25.14 kg/m   Wt Readings from Last 3 Encounters:  12/24/20 178 lb 9.6 oz (81 kg)  12/16/20 171 lb 6.4 oz (77.7 kg)  09/09/19 150 lb 9.6 oz (68.3 kg)    Physical Exam Vitals and nursing note reviewed.  Constitutional:      General: He is awake. He is not in acute distress.    Appearance: He is well-developed and well-groomed. He is not ill-appearing or toxic-appearing.  HENT:     Head: Normocephalic and atraumatic.     Right Ear: Hearing normal. No drainage.     Left Ear: Hearing normal. No drainage.  Eyes:     General: Lids are normal.        Right eye: No discharge.        Left eye: No discharge.     Conjunctiva/sclera: Conjunctivae normal.     Pupils: Pupils are equal, round, and reactive to light.  Neck:     Thyroid: No thyromegaly.     Vascular: No carotid bruit.  Cardiovascular:     Rate and Rhythm: Normal rate and regular rhythm.     Heart  sounds: Normal heart sounds, S1 normal and S2 normal. No murmur heard.   No gallop.  Pulmonary:     Effort: Pulmonary effort is normal. No accessory muscle usage or respiratory distress.     Breath sounds: Normal breath sounds.  Abdominal:     General: Bowel sounds are normal.     Palpations: Abdomen is soft.  Musculoskeletal:     Cervical back: Normal range of motion and neck supple.     Lumbar back: Tenderness (left side) present. No swelling, edema, signs of trauma, spasms or bony tenderness. Decreased range of motion. Positive left straight leg raise test. Negative right straight leg raise test. Scoliosis (mild) present.     Right lower leg: No edema.     Left lower leg: No edema.  Skin:    General: Skin is warm and dry.     Capillary Refill: Capillary  refill takes less than 2 seconds.     Findings: No rash.  Neurological:     Mental Status: He is alert and oriented to person, place, and time.     Deep Tendon Reflexes: Reflexes are normal and symmetric.  Psychiatric:        Attention and Perception: Attention normal.        Mood and Affect: Mood normal.        Speech: Speech normal.        Behavior: Behavior normal. Behavior is cooperative.        Thought Content: Thought content normal.    Results for orders placed or performed in visit on 09/09/19  [id number] 11+Oxyco+Alc+Crt-Bund  Result Value Ref Range   Ethanol Negative Cutoff=0.020 %   Amphetamines, Urine See Final Results Cutoff=1000 ng/mL   Barbiturate Negative Cutoff=200 ng/mL   BENZODIAZ UR QL Negative Cutoff=200 ng/mL   Cannabinoid Quant, Ur See Final Results Cutoff=50 ng/mL   Cocaine (Metabolite) See Final Results Cutoff=300 ng/mL   OPIATE SCREEN URINE Negative Cutoff=300 ng/mL   Oxycodone/Oxymorphone, Urine Negative Cutoff=300 ng/mL   Phencyclidine Negative Cutoff=25 ng/mL   Methadone Screen, Urine Negative Cutoff=300 ng/mL   Propoxyphene Negative Cutoff=300 ng/mL   Meperidine Negative Cutoff=200 ng/mL   Tramadol Negative Cutoff=200 ng/mL   Creatinine 278.7 20.0 - 300.0 mg/dL   pH, Urine 6.0 4.5 - 8.9  Drug Profile [id number]  Result Value Ref Range   Amphetamines Positive (A) Cutoff=1000   Amphetamine Positive (A)    Amphetamine GC/MS Conf 5,820 Cutoff=500 ng/mL   Methamphetamine Negative Cutoff=500  Cannabinoid Conf, Ur  Result Value Ref Range   CANNABINOIDS Positive (A) Cutoff=50   Carboxy THC GC/MS Conf >300 Cutoff=15 ng/mL  Cocaine Con, Ur  Result Value Ref Range   Cocaine Metab Quant, Ur Positive (A) Cutoff=300   Benzoylecgonine GC/MS Conf 1,182 Cutoff=150 ng/mL      Assessment & Plan:   Problem List Items Addressed This Visit       Nervous and Auditory   Acute left-sided low back pain with left-sided sciatica - Primary    Acute x 2 months with some  improvement at this time, but not 100%.  Started after fall playing ball.  At this time recommend he obtain imaging lumbar spine ordered last visit to further assess area. Continue Robaxin PRN for pain.  Recommend taking Tylenol and Ibuprofen as needed for discomfort + using heating pad.  Discussed gentle stretches at home to assist sciatic pain.  PT referral placed today.  Recommend complete rest from heavy lifting at this time.  Return in 4 weeks.  Relevant Orders   Ambulatory referral to Physical Therapy     Follow up plan: Return in about 4 weeks (around 01/21/2021) for Back Pain.

## 2020-12-24 NOTE — Patient Instructions (Signed)
Acute Back Pain, Adult Acute back pain is sudden and usually short-lived. It is often caused by an injury to the muscles and tissues in the back. The injury may result from: A muscle or ligament getting overstretched or torn (strained). Ligaments are tissues that connect bones to each other. Lifting something improperly can cause a back strain. Wear and tear (degeneration) of the spinal disks. Spinal disks are circular tissue that provide cushioning between the bones of the spine (vertebrae). Twisting motions, such as while playing sports or doing yard work. A hit to the back. Arthritis. You may have a physical exam, lab tests, and imaging tests to find the cause ofyour pain. Acute back pain usually goes away with rest and home care. Follow these instructions at home: Managing pain, stiffness, and swelling Treatment may include medicines for pain and inflammation that are taken by mouth or applied to the skin, prescription pain medicine, or muscle relaxants. Take over-the-counter and prescription medicines only as told by your health care provider. Your health care provider may recommend applying ice during the first 24-48 hours after your pain starts. To do this: Put ice in a plastic bag. Place a towel between your skin and the bag. Leave the ice on for 20 minutes, 2-3 times a day. If directed, apply heat to the affected area as often as told by your health care provider. Use the heat source that your health care provider recommends, such as a moist heat pack or a heating pad. Place a towel between your skin and the heat source. Leave the heat on for 20-30 minutes. Remove the heat if your skin turns bright red. This is especially important if you are unable to feel pain, heat, or cold. You have a greater risk of getting burned. Activity  Do not stay in bed. Staying in bed for more than 1-2 days can delay your recovery. Sit up and stand up straight. Avoid leaning forward when you sit or  hunching over when you stand. If you work at a desk, sit close to it so you do not need to lean over. Keep your chin tucked in. Keep your neck drawn back, and keep your elbows bent at a 90-degree angle (right angle). Sit high and close to the steering wheel when you drive. Add lower back (lumbar) support to your car seat, if needed. Take short walks on even surfaces as soon as you are able. Try to increase the length of time you walk each day. Do not sit, drive, or stand in one place for more than 30 minutes at a time. Sitting or standing for long periods of time can put stress on your back. Do not drive or use heavy machinery while taking prescription pain medicine. Use proper lifting techniques. When you bend and lift, use positions that put less stress on your back: Bend your knees. Keep the load close to your body. Avoid twisting. Exercise regularly as told by your health care provider. Exercising helps your back heal faster and helps prevent back injuries by keeping muscles strong and flexible. Work with a physical therapist to make a safe exercise program, as recommended by your health care provider. Do any exercises as told by your physical therapist.  Lifestyle Maintain a healthy weight. Extra weight puts stress on your back and makes it difficult to have good posture. Avoid activities or situations that make you feel anxious or stressed. Stress and anxiety increase muscle tension and can make back pain worse. Learn ways to manage   anxiety and stress, such as through exercise. General instructions Sleep on a firm mattress in a comfortable position. Try lying on your side with your knees slightly bent. If you lie on your back, put a pillow under your knees. Follow your treatment plan as told by your health care provider. This may include: Cognitive or behavioral therapy. Acupuncture or massage therapy. Meditation or yoga. Contact a health care provider if: You have pain that is not  relieved with rest or medicine. You have increasing pain going down into your legs or buttocks. Your pain does not improve after 2 weeks. You have pain at night. You lose weight without trying. You have a fever or chills. Get help right away if: You develop new bowel or bladder control problems. You have unusual weakness or numbness in your arms or legs. You develop nausea or vomiting. You develop abdominal pain. You feel faint. Summary Acute back pain is sudden and usually short-lived. Use proper lifting techniques. When you bend and lift, use positions that put less stress on your back. Take over-the-counter and prescription medicines and apply heat or ice as directed by your health care provider. This information is not intended to replace advice given to you by your health care provider. Make sure you discuss any questions you have with your healthcare provider. Document Revised: 02/18/2020 Document Reviewed: 02/21/2020 Elsevier Patient Education  2022 [employer]  

## 2020-12-24 NOTE — Assessment & Plan Note (Signed)
Acute x 2 months with some improvement at this time, but not 100%.  Started after fall playing ball.  At this time recommend he obtain imaging lumbar spine ordered last visit to further assess area. Continue Robaxin PRN for pain.  Recommend taking Tylenol and Ibuprofen as needed for discomfort + using heating pad.  Discussed gentle stretches at home to assist sciatic pain.  PT referral placed today.  Recommend complete rest from heavy lifting at this time.  Return in 4 weeks.

## 2021-01-19 ENCOUNTER — Other Ambulatory Visit: Payer: Self-pay

## 2021-01-19 ENCOUNTER — Ambulatory Visit
Admission: RE | Admit: 2021-01-19 | Discharge: 2021-01-19 | Disposition: A | Discharge status: Home / Self Care | Payer: BC Managed Care – PPO | Source: Ambulatory Visit | Attending: Nurse Practitioner | Admitting: Nurse Practitioner

## 2021-01-19 ENCOUNTER — Ambulatory Visit
Admission: RE | Admit: 2021-01-19 | Discharge: 2021-01-19 | Disposition: A | Discharge status: Home / Self Care | Payer: BC Managed Care – PPO | Attending: Nurse Practitioner | Admitting: Nurse Practitioner

## 2021-01-19 DIAGNOSIS — M5442 Lumbago with sciatica, left side: Secondary | ICD-10-CM

## 2021-01-20 NOTE — Progress Notes (Signed)
Good morning, please let Jason Griffin know his imaging has returned and his lower back does show some mild arthritic changes.  If ongoing pain I recommend we get him into physical therapy next visit.  For now continue current treatment and wearing back support when at work.  Any questions? Keep being awesome!!  Thank you for allowing me to participate in your care.  I appreciate you. Kindest regards, Fleet Higham

## 2021-01-21 ENCOUNTER — Ambulatory Visit: Payer: BC Managed Care – PPO | Admitting: Nurse Practitioner

## 2021-01-21 ENCOUNTER — Encounter: Payer: Self-pay | Admitting: Nurse Practitioner

## 2021-01-21 ENCOUNTER — Other Ambulatory Visit: Payer: Self-pay

## 2021-01-21 DIAGNOSIS — M5442 Lumbago with sciatica, left side: Secondary | ICD-10-CM

## 2021-01-21 DIAGNOSIS — G44209 Tension-type headache, unspecified, not intractable: Secondary | ICD-10-CM

## 2021-01-21 DIAGNOSIS — R519 Headache, unspecified: Secondary | ICD-10-CM | POA: Insufficient documentation

## 2021-01-21 MED ORDER — METHOCARBAMOL 500 MG PO TABS: 500.0000 mg | ORAL_TABLET | Freq: Three times a day (TID) | ORAL | 4 refills | Status: DC | PRN

## 2021-01-21 MED ORDER — MELOXICAM 7.5 MG PO TABS: 7.5000 mg | ORAL_TABLET | Freq: Every day | ORAL | 0 refills | Status: DC

## 2021-01-21 NOTE — Patient Instructions (Signed)
Acute Back Pain, Adult Acute back pain is sudden and usually short-lived. It is often caused by an injury to the muscles and tissues in the back. The injury may result from: A muscle or ligament getting overstretched or torn (strained). Ligaments are tissues that connect bones to each other. Lifting something improperly can cause a back strain. Wear and tear (degeneration) of the spinal disks. Spinal disks are circular tissue that provide cushioning between the bones of the spine (vertebrae). Twisting motions, such as while playing sports or doing yard work. A hit to the back. Arthritis. You may have a physical exam, lab tests, and imaging tests to find the cause ofyour pain. Acute back pain usually goes away with rest and home care. Follow these instructions at home: Managing pain, stiffness, and swelling Treatment may include medicines for pain and inflammation that are taken by mouth or applied to the skin, prescription pain medicine, or muscle relaxants. Take over-the-counter and prescription medicines only as told by your health care provider. Your health care provider may recommend applying ice during the first 24-48 hours after your pain starts. To do this: Put ice in a plastic bag. Place a towel between your skin and the bag. Leave the ice on for 20 minutes, 2-3 times a day. If directed, apply heat to the affected area as often as told by your health care provider. Use the heat source that your health care provider recommends, such as a moist heat pack or a heating pad. Place a towel between your skin and the heat source. Leave the heat on for 20-30 minutes. Remove the heat if your skin turns bright red. This is especially important if you are unable to feel pain, heat, or cold. You have a greater risk of getting burned. Activity  Do not stay in bed. Staying in bed for more than 1-2 days can delay your recovery. Sit up and stand up straight. Avoid leaning forward when you sit or  hunching over when you stand. If you work at a desk, sit close to it so you do not need to lean over. Keep your chin tucked in. Keep your neck drawn back, and keep your elbows bent at a 90-degree angle (right angle). Sit high and close to the steering wheel when you drive. Add lower back (lumbar) support to your car seat, if needed. Take short walks on even surfaces as soon as you are able. Try to increase the length of time you walk each day. Do not sit, drive, or stand in one place for more than 30 minutes at a time. Sitting or standing for long periods of time can put stress on your back. Do not drive or use heavy machinery while taking prescription pain medicine. Use proper lifting techniques. When you bend and lift, use positions that put less stress on your back: Bend your knees. Keep the load close to your body. Avoid twisting. Exercise regularly as told by your health care provider. Exercising helps your back heal faster and helps prevent back injuries by keeping muscles strong and flexible. Work with a physical therapist to make a safe exercise program, as recommended by your health care provider. Do any exercises as told by your physical therapist.  Lifestyle Maintain a healthy weight. Extra weight puts stress on your back and makes it difficult to have good posture. Avoid activities or situations that make you feel anxious or stressed. Stress and anxiety increase muscle tension and can make back pain worse. Learn ways to manage   anxiety and stress, such as through exercise. General instructions Sleep on a firm mattress in a comfortable position. Try lying on your side with your knees slightly bent. If you lie on your back, put a pillow under your knees. Follow your treatment plan as told by your health care provider. This may include: Cognitive or behavioral therapy. Acupuncture or massage therapy. Meditation or yoga. Contact a health care provider if: You have pain that is not  relieved with rest or medicine. You have increasing pain going down into your legs or buttocks. Your pain does not improve after 2 weeks. You have pain at night. You lose weight without trying. You have a fever or chills. Get help right away if: You develop new bowel or bladder control problems. You have unusual weakness or numbness in your arms or legs. You develop nausea or vomiting. You develop abdominal pain. You feel faint. Summary Acute back pain is sudden and usually short-lived. Use proper lifting techniques. When you bend and lift, use positions that put less stress on your back. Take over-the-counter and prescription medicines and apply heat or ice as directed by your health care provider. This information is not intended to replace advice given to you by your health care provider. Make sure you discuss any questions you have with your healthcare provider. Document Revised: 02/18/2020 Document Reviewed: 02/21/2020 Elsevier Patient Education  2022 [employer]  

## 2021-01-21 NOTE — Assessment & Plan Note (Signed)
Acute, after injury at work.  Recommend he report this to workplace, even if improving.  At this time will send in Meloxicam to take as needed + recommend use of Tylenol at home.  No major injury noted on exam and neuro exam normal.  No red flags.  Recommend application to ice to back of occiput as needed and ensure plenty of rest and fluids.  Return to office as needed or for worsening.

## 2021-01-21 NOTE — Assessment & Plan Note (Signed)
Acute x 2 months with improvement at this time.  Started after fall playing ball.  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 Continue Robaxin PRN for pain.  Recommend taking Tylenol and Ibuprofen as needed for discomfort + using heating pad.  Discussed gentle stretches at home to assist sciatic pain.  PT referral placed last visit, but wishes to hold off on this.  Return as needed.

## 2021-01-21 NOTE — Progress Notes (Signed)
BP 128/75   Pulse 87   Wt 178 lb (80.7 kg)   SpO2 98%   BMI 25.06 kg/m    Subjective:    Patient ID: Jason Griffin, male    DOB: 1995-08-29, 25 y.o.   MRN: [id number]  HPI: Jason Griffin is a 25 y.o. male  Chief Complaint  Patient presents with   Generalized Body Aches    Patient states his back is somewhat better, but he states he is tired and has a dull ache in his whole body aches and states he hit head at work, and states he has some headaches since he hit his head at work. Patient states he took some his leftover muscle relaxers and it does not even help.   BACK PAIN Follow-up for back pain -- takes Robaxin as needed, which helps.  Slowly has been feeling better and did not do physical therapy.  Wearing back brace.  X-ray did note some mild arthritic changes at L5-S1.   Duration: weeks Mechanism of injury:  fall Location: Left and low back Onset: gradual Severity: 6/10 worst and at present 0/10 Quality: sharp, aching, shooting, and throbbing Frequency: intermittent -- now becoming more constant Radiation: L leg below the knee Aggravating factors: lifting, movement, and bending Alleviating factors:  pillow propping in bed Status: fluctuating Treatments attempted: APAP and ibuprofen  Relief with NSAIDs?: moderate Nighttime pain:  yes Paresthesias / decreased sensation:  no Bowel / bladder incontinence:  no Fevers:  no Dysuria / urinary frequency:  no   HEADACHE Recently got head pushed up against a wall with a box at work, on Tuesday -- sent head into wall, did not notice any pain until yesterday when took day off.  Did not report injury to workplace.  Notices discomfort if touches back of head. Duration: days Onset: sudden Severity: 6/10 Quality: dull and aching Frequency: intermittent Location: posterior occiput Headache duration: Radiation: no Time of day headache occurs: varies Alleviating factors: nothing Aggravating factors: touching makes pain  worse Headache status at time of visit: current headache Treatments attempted: Treatments attempted:  muscle relaxer    Aura: no Nausea:  no Vomiting: no Photophobia:  no Phonophobia:  no Effect on social functioning:  no Numbers of missed days of school/work each month: none Confusion:  no Gait disturbance/ataxia:  no Behavioral changes:  no Fevers:  no   Relevant past medical, surgical, family and social history reviewed and updated as indicated. Interim medical history since our last visit reviewed. Allergies and medications reviewed and updated.  Review of Systems  Constitutional:  Negative for activity change, diaphoresis, fatigue and fever.  Respiratory:  Negative for cough, chest tightness, shortness of breath and wheezing.   Cardiovascular:  Negative for chest pain, palpitations and leg swelling.  Gastrointestinal: Negative.   Musculoskeletal:  Positive for back pain.  Neurological: Negative.   Psychiatric/Behavioral: Negative.     Per HPI unless specifically indicated above     Objective:    BP 128/75   Pulse 87   Wt 178 lb (80.7 kg)   SpO2 98%   BMI 25.06 kg/m   Wt Readings from Last 3 Encounters:  01/21/21 178 lb (80.7 kg)  12/24/20 178 lb 9.6 oz (81 kg)  12/16/20 171 lb 6.4 oz (77.7 kg)    Physical Exam Vitals and nursing note reviewed.  Constitutional:      General: He is awake. He is not in acute distress.    Appearance: He is well-developed and well-groomed.  He is not ill-appearing or toxic-appearing.  HENT:     Head: Normocephalic and atraumatic.     Right Ear: Hearing normal. No drainage.     Left Ear: Hearing normal. No drainage.  Eyes:     General: Lids are normal.        Right eye: No discharge.        Left eye: No discharge.     Extraocular Movements: Extraocular movements intact.     Conjunctiva/sclera: Conjunctivae normal.     Pupils: Pupils are equal, round, and reactive to light.     Visual Fields: Right eye visual fields normal and  left eye visual fields normal.  Neck:     Thyroid: No thyromegaly.     Vascular: No carotid bruit.  Cardiovascular:     Rate and Rhythm: Normal rate and regular rhythm.     Heart sounds: Normal heart sounds, S1 normal and S2 normal. No murmur heard.   No gallop.  Pulmonary:     Effort: Pulmonary effort is normal. No accessory muscle usage or respiratory distress.     Breath sounds: Normal breath sounds.  Abdominal:     General: Bowel sounds are normal.     Palpations: Abdomen is soft.  Musculoskeletal:     Cervical back: Normal range of motion and neck supple.     Lumbar back: No swelling, edema, signs of trauma, spasms, tenderness or bony tenderness. Normal range of motion. Negative right straight leg raise test and negative left straight leg raise test. Scoliosis (mild) present.     Right lower leg: No edema.     Left lower leg: No edema.  Skin:    General: Skin is warm and dry.     Capillary Refill: Capillary refill takes less than 2 seconds.     Findings: No rash.  Neurological:     Mental Status: He is alert and oriented to person, place, and time.     Cranial Nerves: Cranial nerves are intact.     Motor: Motor function is intact.     Coordination: Coordination is intact.     Deep Tendon Reflexes: Reflexes are normal and symmetric.  Psychiatric:        Attention and Perception: Attention normal.        Mood and Affect: Mood normal.        Speech: Speech normal.        Behavior: Behavior normal. Behavior is cooperative.        Thought Content: Thought content normal.   Results for orders placed or performed in visit on 09/09/19  [id number] 11+Oxyco+Alc+Crt-Bund  Result Value Ref Range   Ethanol Negative Cutoff=0.020 %   Amphetamines, Urine See Final Results Cutoff=1000 ng/mL   Barbiturate Negative Cutoff=200 ng/mL   BENZODIAZ UR QL Negative Cutoff=200 ng/mL   Cannabinoid Quant, Ur See Final Results Cutoff=50 ng/mL   Cocaine (Metabolite) See Final Results Cutoff=300 ng/mL    OPIATE SCREEN URINE Negative Cutoff=300 ng/mL   Oxycodone/Oxymorphone, Urine Negative Cutoff=300 ng/mL   Phencyclidine Negative Cutoff=25 ng/mL   Methadone Screen, Urine Negative Cutoff=300 ng/mL   Propoxyphene Negative Cutoff=300 ng/mL   Meperidine Negative Cutoff=200 ng/mL   Tramadol Negative Cutoff=200 ng/mL   Creatinine 278.7 20.0 - 300.0 mg/dL   pH, Urine 6.0 4.5 - 8.9  Drug Profile [id number]  Result Value Ref Range   Amphetamines Positive (A) Cutoff=1000   Amphetamine Positive (A)    Amphetamine GC/MS Conf 5,820 Cutoff=500 ng/mL   Methamphetamine Negative Cutoff=500  Cannabinoid  Conf, Ur  Result Value Ref Range   CANNABINOIDS Positive (A) Cutoff=50   Carboxy THC GC/MS Conf >300 Cutoff=15 ng/mL  Cocaine Con, Ur  Result Value Ref Range   Cocaine Metab Quant, Ur Positive (A) Cutoff=300   Benzoylecgonine GC/MS Conf 1,182 Cutoff=150 ng/mL      Assessment & Plan:   Problem List Items Addressed This Visit       Nervous and Auditory   Acute left-sided low back pain with left-sided sciatica    Acute x 2 months with improvement at this time.  Started after fall playing ball.  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 Continue Robaxin PRN for pain.  Recommend taking Tylenol and Ibuprofen as needed for discomfort + using heating pad.  Discussed gentle stretches at home to assist sciatic pain.  PT referral placed last visit, but wishes to hold off on this.  Return as needed.      Relevant Medications   methocarbamol (ROBAXIN) 500 MG tablet   meloxicam (MOBIC) 7.5 MG tablet     Other   Headache    Acute, after injury at work.  Recommend he report this to workplace, even if improving.  At this time will send in Meloxicam to take as needed + recommend use of Tylenol at home.  No major injury noted on exam and neuro exam normal.  No red flags.  Recommend application to ice to back of occiput as needed and ensure plenty of rest and fluids.  Return to office as needed or for worsening.       Relevant Medications   methocarbamol (ROBAXIN) 500 MG tablet   meloxicam (MOBIC) 7.5 MG tablet     Follow up plan: Return if symptoms worsen or fail to improve.

## 2021-01-25 ENCOUNTER — Ambulatory Visit: Payer: Self-pay

## 2021-01-25 NOTE — Telephone Encounter (Signed)
Patient notified

## 2021-01-25 NOTE — Telephone Encounter (Signed)
Routing to provider to advise on nurse triage message regarding patient.

## 2021-01-25 NOTE — Telephone Encounter (Signed)
Pt. Reports he was injured at work last week and has hit on right side of head with boxes. Continues to have headaches that interrupt his sleep. No availability in practice today. Pt. Has not notified his employer of injury. Instructed to do that today. Refuses UC/ED. Appointment made for tomorrow. Request PCP know he is still having headaches, light hurts his eyes as well. Please advise.

## 2021-01-25 NOTE — Telephone Encounter (Signed)
Message from Valora Piccolo sent at 01/25/2021  9:19 AM EDT  Pts mother (that is not on DPR)is caling for an appt for PT. PT hit was hit in the head with boxes at work. Pt was seen for an office visit last week.     Call History   Type Contact Phone/Fax User  01/25/2021 09:07 AM EDT Phone (Incoming) Cleone, Gurdon W (Self) [phone] Rexene Edison) Jens Som A   Reason for Disposition  [1] After 72 hours AND [2] headache persists  Answer Assessment - Initial Assessment Questions 1. MECHANISM: "How did the injury happen?" For falls, ask: "What height did you fall from?" and "What surface did you fall against?"      At work - boxes hit head 2. ONSET: "When did the injury happen?" (Minutes or hours ago)      Last Tuesday 3. NEUROLOGIC SYMPTOMS: "Was there any loss of consciousness?" "Are there any other neurological symptoms?"      Headache 4. MENTAL STATUS: "Does the person know who they are, who you are, and where they are?"      Alert 5. LOCATION: "What part of the head was hit?"      Right side 6. SCALP APPEARANCE: "What does the scalp look like? Is it bleeding now?" If Yes, ask: "Is it difficult to stop?"      No 7. SIZE: For cuts, bruises, or swelling, ask: "How large is it?" (e.g., inches or centimeters)      No 8. PAIN: "Is there any pain?" If Yes, ask: "How bad is it?"  (e.g., Scale 1-10; or mild, moderate, severe)     8 9. TETANUS: For any breaks in the skin, ask: "When was the last tetanus booster?"     Unsure 10. OTHER SYMPTOMS: "Do you have any other symptoms?" (e.g., neck pain, vomiting)       No 11. PREGNANCY: "Is there any chance you are pregnant?" "When was your last menstrual period?"       N/a  Protocols used: Head Injury-A-AH

## 2021-01-26 ENCOUNTER — Ambulatory Visit: Payer: BC Managed Care – PPO | Admitting: Family Medicine

## 2021-01-28 ENCOUNTER — Telehealth: Payer: Self-pay | Admitting: Family Medicine

## 2021-01-28 ENCOUNTER — Telehealth: Payer: Self-pay

## 2021-01-28 ENCOUNTER — Ambulatory Visit: Payer: BC Managed Care – PPO | Admitting: Family Medicine

## 2021-01-28 ENCOUNTER — Encounter: Payer: Self-pay | Admitting: Family Medicine

## 2021-01-28 ENCOUNTER — Other Ambulatory Visit: Payer: Self-pay

## 2021-01-28 ENCOUNTER — Ambulatory Visit
Admission: RE | Admit: 2021-01-28 | Discharge: 2021-01-28 | Disposition: A | Discharge status: Home / Self Care | Payer: BC Managed Care – PPO | Source: Ambulatory Visit | Attending: Family Medicine | Admitting: Family Medicine

## 2021-01-28 VITALS — BP 118/74 | HR 82 | Temp 98.9°F | Ht 70.39 in | Wt 171.1 lb

## 2021-01-28 DIAGNOSIS — R519 Headache, unspecified: Secondary | ICD-10-CM

## 2021-01-28 DIAGNOSIS — S0990XD Unspecified injury of head, subsequent encounter: Secondary | ICD-10-CM

## 2021-01-28 DIAGNOSIS — S060X0A Concussion without loss of consciousness, initial encounter: Secondary | ICD-10-CM

## 2021-01-28 MED ORDER — NORTRIPTYLINE HCL 10 MG PO CAPS: 10.0000 mg | ORAL_CAPSULE | Freq: Every day | ORAL | 0 refills | Status: DC

## 2021-01-28 NOTE — Telephone Encounter (Signed)
Patient made aware of results and verbalized understanding.  

## 2021-01-28 NOTE — Telephone Encounter (Signed)
Copied from CRM [id number]. Topic: General - Other >> Jan 28, 2021  3:46 PM Pawlus, Maxine Glenn A wrote: Reason for CRM: Pt called back to go over imaging/lab results, please advise.

## 2021-01-28 NOTE — Telephone Encounter (Signed)
Called to do a Peer to Peer on [facility].   Approval #[id number] Valid 8/18-9/16

## 2021-01-28 NOTE — Progress Notes (Signed)
BP 118/74   Pulse 82   Temp 98.9 F (37.2 C)   Ht 5' 10.39" (1.788 m)   Wt 171 lb 2 oz (77.6 kg)   SpO2 95%   BMI 24.28 kg/m    Subjective:    Patient ID: Jason Griffin, male    DOB: [birth year], 25 y.o.   MRN: [id number]  HPI: Jason Griffin is a 25 y.o. male  Chief Complaint  Patient presents with   Head Injury    Head was pinned against the wall with box's on 01/19/21   Evaluated by PCP for head injury on 01/21/21 with mild symptoms. Symptoms have not resolved. Continues with headache. He has been having headache that is keeping him from sleeping. He notes that he is not falling asleep He thinks that he is getting about 6 hours of sleep a night but is still feeling very tired. He has been nauseous, but is doing a bit better with that. No vomiting. Has not been to work since Thursday. Has not been working at home. 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 He has had the lights hurting his eyes. He notes that changes in positions seem to make the headache worse. Loud noises make it worse. No changes in the headache with activity. Nothing makes it better. Muscle relaxor and meloxicam had not been helping.   Relevant past medical, surgical, family and social history reviewed and updated as indicated. Interim medical history since our last visit reviewed. Allergies and medications reviewed and updated.  Review of Systems  Constitutional: Negative.   HENT: Negative.    Respiratory: Negative.    Cardiovascular: Negative.   Gastrointestinal: Negative.   Musculoskeletal:  Positive for back pain, myalgias, neck pain and neck stiffness. Negative for arthralgias, gait problem and joint swelling.  Skin: Negative.   Neurological:  Positive for dizziness and headaches. Negative for tremors, seizures, syncope, facial asymmetry, speech difficulty, weakness, light-headedness and numbness.  Psychiatric/Behavioral: Negative.     Per HPI unless specifically indicated above     Objective:    BP 118/74    Pulse 82   Temp 98.9 F (37.2 C)   Ht 5' 10.39" (1.788 m)   Wt 171 lb 2 oz (77.6 kg)   SpO2 95%   BMI 24.28 kg/m   Wt Readings from Last 3 Encounters:  01/28/21 171 lb 2 oz (77.6 kg)  01/21/21 178 lb (80.7 kg)  12/24/20 178 lb 9.6 oz (81 kg)    Physical Exam Vitals and nursing note reviewed.  Constitutional:      General: He is not in acute distress.    Appearance: Normal appearance. He is not ill-appearing, toxic-appearing or diaphoretic.  HENT:     Head: Normocephalic and atraumatic.     Right Ear: External ear normal.     Left Ear: External ear normal.     Nose: Nose normal.     Mouth/Throat:     Mouth: Mucous membranes are moist.     Pharynx: Oropharynx is clear.  Eyes:     General: No scleral icterus.       Right eye: No discharge.        Left eye: No discharge.     Extraocular Movements: Extraocular movements intact.     Conjunctiva/sclera: Conjunctivae normal.     Pupils: Pupils are equal, round, and reactive to light.  Cardiovascular:     Rate and Rhythm: Normal rate and regular rhythm.     Pulses: Normal pulses.  Heart sounds: Normal heart sounds. No murmur heard.   No friction rub. No gallop.  Pulmonary:     Effort: Pulmonary effort is normal. No respiratory distress.     Breath sounds: Normal breath sounds. No stridor. No wheezing, rhonchi or rales.  Chest:     Chest wall: No tenderness.  Musculoskeletal:        General: Tenderness (back of the head) present. Normal range of motion.     Cervical back: Normal range of motion and neck supple.  Skin:    General: Skin is warm and dry.     Capillary Refill: Capillary refill takes less than 2 seconds.     Coloration: Skin is not jaundiced or pale.     Findings: No bruising, erythema, lesion or rash.  Neurological:     General: No focal deficit present.     Mental Status: He is alert and oriented to person, place, and time. Mental status is at baseline.     Cranial Nerves: No cranial nerve deficit.      Sensory: No sensory deficit.     Motor: No weakness.     Coordination: Coordination normal.     Gait: Gait normal.     Deep Tendon Reflexes: Reflexes normal.  Psychiatric:        Mood and Affect: Mood normal.        Behavior: Behavior normal.        Thought Content: Thought content normal.        Judgment: Judgment normal.    Results for orders placed or performed in visit on 09/09/19  [id number] 11+Oxyco+Alc+Crt-Bund  Result Value Ref Range   Ethanol Negative Cutoff=0.020 %   Amphetamines, Urine See Final Results Cutoff=1000 ng/mL   Barbiturate Negative Cutoff=200 ng/mL   BENZODIAZ UR QL Negative Cutoff=200 ng/mL   Cannabinoid Quant, Ur See Final Results Cutoff=50 ng/mL   Cocaine (Metabolite) See Final Results Cutoff=300 ng/mL   OPIATE SCREEN URINE Negative Cutoff=300 ng/mL   Oxycodone/Oxymorphone, Urine Negative Cutoff=300 ng/mL   Phencyclidine Negative Cutoff=25 ng/mL   Methadone Screen, Urine Negative Cutoff=300 ng/mL   Propoxyphene Negative Cutoff=300 ng/mL   Meperidine Negative Cutoff=200 ng/mL   Tramadol Negative Cutoff=200 ng/mL   Creatinine 278.7 20.0 - 300.0 mg/dL   pH, Urine 6.0 4.5 - 8.9  Drug Profile [phone]  Result Value Ref Range   Amphetamines Positive (A) Cutoff=1000   Amphetamine Positive (A)    Amphetamine GC/MS Conf 5,820 Cutoff=500 ng/mL   Methamphetamine Negative Cutoff=500  Cannabinoid Conf, Ur  Result Value Ref Range   CANNABINOIDS Positive (A) Cutoff=50   Carboxy THC GC/MS Conf >300 Cutoff=15 ng/mL  Cocaine Con, Ur  Result Value Ref Range   Cocaine Metab Quant, Ur Positive (A) Cutoff=300   Benzoylecgonine GC/MS Conf 1,182 Cutoff=150 ng/mL      Assessment & Plan:   Problem List Items Addressed This Visit   None Visit Diagnoses     Concussion without loss of consciousness, initial encounter    -  Primary   Will check CT and start nortriptyline. Brain rest. Out of work for 1 week. Follow up with PCP next week. Call with any concerns.    Relevant  Orders   CT HEAD WO CONTRAST ( )   Traumatic injury of head, subsequent encounter       Headache not improving. Will get him CT to r/o slow bleed. Likely concussion. Await results.   Relevant Orders   CT HEAD WO CONTRAST ( )   Persistent headaches  Headache not improving. Will get him CT to r/o slow bleed. Likely concussion. Await results.   Relevant Medications   nortriptyline (PAMELOR) 10 MG capsule   Other Relevant Orders   CT HEAD WO CONTRAST ( )        Follow up plan: Return follow up with PCP next week.

## 2021-01-29 ENCOUNTER — Ambulatory Visit: Payer: BC Managed Care – PPO | Admitting: Family Medicine

## 2021-02-05 ENCOUNTER — Encounter: Payer: Self-pay | Admitting: Nurse Practitioner

## 2021-02-05 ENCOUNTER — Ambulatory Visit (INDEPENDENT_AMBULATORY_CARE_PROVIDER_SITE_OTHER): Payer: BC Managed Care – PPO | Admitting: Nurse Practitioner

## 2021-02-05 DIAGNOSIS — G44209 Tension-type headache, unspecified, not intractable: Secondary | ICD-10-CM

## 2021-02-05 NOTE — Assessment & Plan Note (Signed)
Due to concussion beginning of August, overall symptoms have improved at this time and recent CT scan was reassuring.  Recommend continue to rest and drink plenty of fluids.  To return to work Monday, if any worsening upon return to alert provider.

## 2021-02-05 NOTE — Patient Instructions (Signed)
Concussion, Adult  A concussion is a brain injury from a hard, direct hit (trauma) to your head or body. This direct hit causes your brain to quickly shake back and forth inside your skull. A concussion may also be called a mild traumaticbrain injury (TBI). Healing from this injury can take time. What are the causes? This condition is caused by: A direct hit to your head, such as: Running into a player during a game. Being hit in a fight. Hitting your head on a hard surface. A quick and sudden movement of the head or neck, such as in a car crash. What are the signs or symptoms? The signs of a concussion can be hard to notice. They may be missed by you, family members, and doctors. You may look fine on the outside but may not actor feel normal. Physical symptoms Headaches. Being dizzy. Problems with body balance. Being sensitive to light or noise. Vomiting or feeling like you may vomit. Being tired. Problems seeing or hearing. Not sleeping or eating as you used to. Seizure. Mental and emotional symptoms Feeling grouchy (irritable). Having mood changes. Problems remembering things. Trouble focusing your mind (concentrating), organizing, or making decisions. Being slow to think, act, react, speak, or read. Feeling worried or nervous (anxious). Feeling sad (depressed). How is this treated? This condition may be treated by: Stopping sports or activity if you are injured. If you hit your head or have signs of concussion: Do not return to sports or activities the same day. Get checked by a doctor before you return to your activities. Resting your body and your mind. Being watched carefully, often at home. Medicines to help with symptoms such as: Headaches. Feeling like you may vomit. Problems with sleep. Avoiding alcohol and drugs. Being asked to go to a concussion clinic or a place to help you recover (rehabilitation center). Recovery from a concussion can take time. Return to  activities only: When you are fully healed. When your doctor says it is safe. Avoid taking strong pain medicines (opioids) for a concussion. Follow these instructions at home: Activity Limit activities that need a lot of thought or focus, such as: Homework or work for your job. Watching TV. Using the computer or phone. Playing memory games and puzzles. Rest. Rest helps your brain heal. Make sure you: Get plenty of sleep. Most adults should get 7-9 hours of sleep each night. Rest during the day. Take naps or breaks when you feel tired. Avoid activity like exercise until your doctor says its safe. Stop any activity that makes symptoms worse. Do not do activities that could cause a second concussion, such as riding a bike or playing sports. Ask your doctor when you can return to your normal activities, such as school, work, sports, and driving. Your ability to react may be slower. Do not do these activities if you are dizzy. General instructions  Take over-the-counter and prescription medicines only as told by your doctor. Do not drink alcohol until your doctor says you can. Watch your symptoms and tell other people to do the same. Other problems can occur after a concussion. Older adults have a higher risk of serious problems. Tell your work Production designer, theatre/television/film, teachers, Tax adviser, school counselor, coach, or Event organiser about your injury and symptoms. Tell them about what you can or cannot do. Keep all follow-up visits as told by your doctor. This is important.  How is this prevented? It is very important that you do not get another brain injury. In rare  cases, another injury can cause brain damage that will not go away, brain swelling, or death. The risk of this is greatest in the first 7-10 days after a head injury. To avoid injuries: Stop activities that could lead to a second concussion, such as contact sports, until your doctor says it is okay. When you return to sports or  activities: Do not crash into other players. This is how most concussions happen. Follow the rules. Respect other players. Do not engage in violent behavior while playing. Get regular exercise. Do strength and balance training. Wear a helmet that fits you well during sports, biking, or other activities. Helmets can help protect you from serious skull and brain injuries, but they do not protect you from a concussion. Even when wearing a helmet, you should avoid being hit in the head. Contact a doctor if: Your symptoms do not get better. You have new symptoms. You have another injury. Get help right away if: You have bad headaches or your headaches get worse. You feel weak or numb in any part of your body. You feel mixed up (confused). Your balance gets worse. You vomit often. You feel more sleepy than normal. You cannot speak well, or have slurred speech. You have a seizure. Others have trouble waking you up. You have changes in how you act. You have changes in how you see (vision). You pass out (lose consciousness). These symptoms may be an emergency. Do not wait to see if the symptoms will go away. Get medical help right away. Call your local emergency services (911 in the [place] Do not drive yourself to the hospital. Summary A concussion is a brain injury from a hard, direct hit (trauma) to your head or body. This condition is treated with rest and careful watching of symptoms. Ask your doctor when you can return to your normal activities, such as school, work, or driving. Get help right away if you have a very bad headache, feel weak in any part of your body, have a seizure, have changes in how you act or see, or if you are mixed up or more sleepy than normal. This information is not intended to replace advice given to you by your health care provider. Make sure you discuss any questions you have with your healthcare provider. Document Revised: 04/11/2019 Document Reviewed:  04/11/2019 Elsevier Patient Education  2022 [employer].

## 2021-02-05 NOTE — Progress Notes (Signed)
There were no vitals taken for this visit.   Subjective:    Patient ID: Jason Griffin, male    DOB: 1995-08-23, 25 y.o.   MRN: [id number]  HPI: Jason Griffin is a 25 y.o. male  Chief Complaint  Patient presents with   Concussion    Patient states he is getting better and states his head feels a lot better and states that he stopped taking Nortriptyline due to medication causing aggravation.     This visit was completed via telephone due to the restrictions of the COVID-19 pandemic. All issues as above were discussed and addressed but no physical exam was performed. If it was felt that the patient should be evaluated in the office, they were directed there. The patient verbally consented to this visit. Patient was unable to complete an audio/visual visit due to Technical difficulties", "Lack of internet. Due to the catastrophic nature of the COVID-19 pandemic, this visit was done through audio contact only. Location of the patient: home Location of the provider: work Those involved with this call:  Provider: Aura Dials, DNP CMA: Malen Gauze, CMA Front Desk/Registration: Kandice Hams  Time spent on call:  21 minutes on the phone discussing health concerns. 15 minutes total spent in review of patient's record and preparation of their chart. I verified patient identity using two factors (patient name and date of birth). Patient consents verbally to being seen via telemedicine visit today.     HEADACHE Seen for this on 01/21/21 and again 01/28/21.  Had got head pushed up against a wall with a box at work -- sent head into wall, did not notice any pain initially.  Did report injury to workplace.  Was started on Nortriptyline last visit and currently has stopped taking this due to agitation with it.  Reports overall headache has improved and he is returning to work on Monday.  Recent CT scan showed no acute processes. Duration: days Onset: sudden Severity: 0/10 Quality: dull  and aching Frequency: intermittent Location: posterior occiput Headache duration: Radiation: no Time of day headache occurs: varies Alleviating factors: nothing Aggravating factors: touching makes pain worse Headache status at time of visit: current headache Treatments attempted: Treatments attempted:  muscle relaxer    Aura: no Nausea:  no Vomiting: no Photophobia:  no Phonophobia:  no Effect on social functioning:  no Numbers of missed days of school/work each month: none Confusion:  no Gait disturbance/ataxia:  no Behavioral changes:  no Fevers:  no   Relevant past medical, surgical, family and social history reviewed and updated as indicated. Interim medical history since our last visit reviewed. Allergies and medications reviewed and updated.  Review of Systems  Constitutional:  Negative for activity change, diaphoresis, fatigue and fever.  Respiratory:  Negative for cough, chest tightness, shortness of breath and wheezing.   Cardiovascular:  Negative for chest pain, palpitations and leg swelling.  Gastrointestinal: Negative.   Neurological: Negative.   Psychiatric/Behavioral: Negative.     Per HPI unless specifically indicated above     Objective:    There were no vitals taken for this visit.  Wt Readings from Last 3 Encounters:  01/28/21 171 lb 2 oz (77.6 kg)  01/21/21 178 lb (80.7 kg)  12/24/20 178 lb 9.6 oz (81 kg)    Physical Exam  Unable to perform due to telephone visit only.  Results for orders placed or performed in visit on 09/09/19  [id number] 11+Oxyco+Alc+Crt-Bund  Result Value Ref Range   Ethanol Negative Cutoff=0.020 %  Amphetamines, Urine See Final Results Cutoff=1000 ng/mL   Barbiturate Negative Cutoff=200 ng/mL   BENZODIAZ UR QL Negative Cutoff=200 ng/mL   Cannabinoid Quant, Ur See Final Results Cutoff=50 ng/mL   Cocaine (Metabolite) See Final Results Cutoff=300 ng/mL   OPIATE SCREEN URINE Negative Cutoff=300 ng/mL   Oxycodone/Oxymorphone,  Urine Negative Cutoff=300 ng/mL   Phencyclidine Negative Cutoff=25 ng/mL   Methadone Screen, Urine Negative Cutoff=300 ng/mL   Propoxyphene Negative Cutoff=300 ng/mL   Meperidine Negative Cutoff=200 ng/mL   Tramadol Negative Cutoff=200 ng/mL   Creatinine 278.7 20.0 - 300.0 mg/dL   pH, Urine 6.0 4.5 - 8.9  Drug Profile [phone]  Result Value Ref Range   Amphetamines Positive (A) Cutoff=1000   Amphetamine Positive (A)    Amphetamine GC/MS Conf 5,820 Cutoff=500 ng/mL   Methamphetamine Negative Cutoff=500  Cannabinoid Conf, Ur  Result Value Ref Range   CANNABINOIDS Positive (A) Cutoff=50   Carboxy THC GC/MS Conf >300 Cutoff=15 ng/mL  Cocaine Con, Ur  Result Value Ref Range   Cocaine Metab Quant, Ur Positive (A) Cutoff=300   Benzoylecgonine GC/MS Conf 1,182 Cutoff=150 ng/mL      Assessment & Plan:   Problem List Items Addressed This Visit       Other   Headache    Due to concussion beginning of August, overall symptoms have improved at this time and recent CT scan was reassuring.  Recommend continue to rest and drink plenty of fluids.  To return to work Monday, if any worsening upon return to alert provider.       I discussed the assessment and treatment plan with the patient. The patient was provided an opportunity to ask questions and all were answered. The patient agreed with the plan and demonstrated an understanding of the instructions.   The patient was advised to call back or seek an in-person evaluation if the symptoms worsen or if the condition fails to improve as anticipated.   I provided 21+ minutes of time during this encounter.   Follow up plan: Return if symptoms worsen or fail to improve.

## 2021-02-08 ENCOUNTER — Encounter: Payer: Self-pay | Admitting: Nurse Practitioner

## 2021-02-08 ENCOUNTER — Telehealth: Payer: Self-pay | Admitting: Nurse Practitioner

## 2021-02-08 NOTE — Telephone Encounter (Signed)
Routing to provider  

## 2021-02-08 NOTE — Telephone Encounter (Unsigned)
Requesting a Dr. Note excusing him from work for 1 week or light duty whatever PCP thinks. Patient states light and sound is still bother him. Best # to call back [phone]  or [phone]

## 2021-10-05 IMAGING — CT CT HEAD W/O CM
3 series · 16 of 47 positions shown, 19 images · non-contrast
Comparison: None.

CLINICAL DATA: Head trauma, headache, concussion

EXAM:
CT HEAD WITHOUT CONTRAST
TECHNIQUE: Contiguous axial images were obtained from the base of the skull
through the vertex without intravenous contrast.

[Series 2: head wo · axial · 0.42mm/px · z∈[-87,+53]mm · 10 of 34 slices shown, 13 images]
[im 3/34  brain]
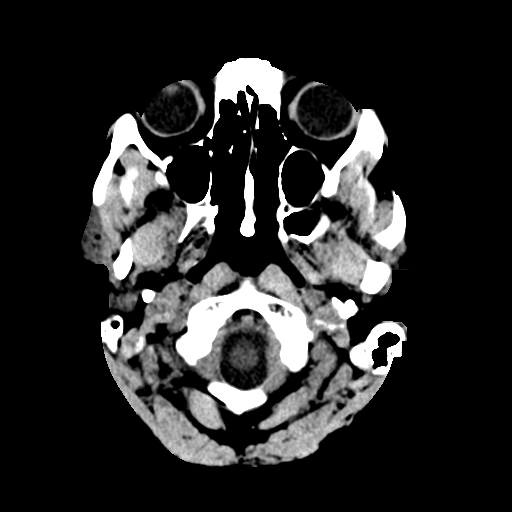
[im 3/34  bone]
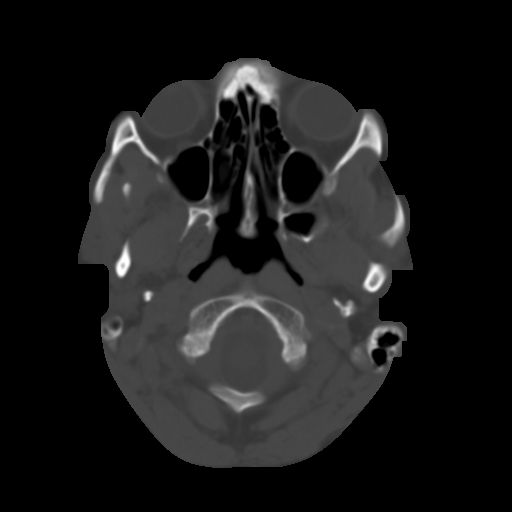
[im 6/34  brain]
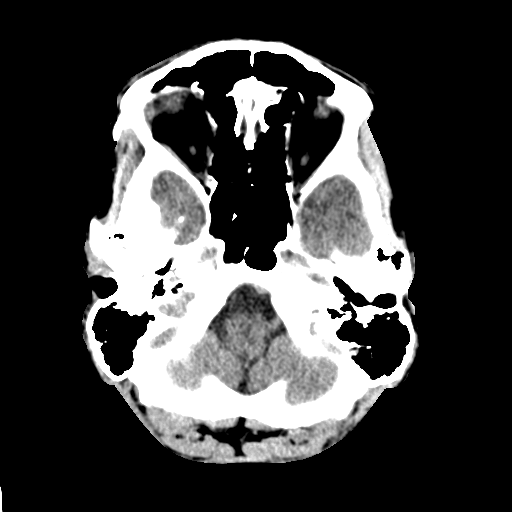
[im 10/34  brain]
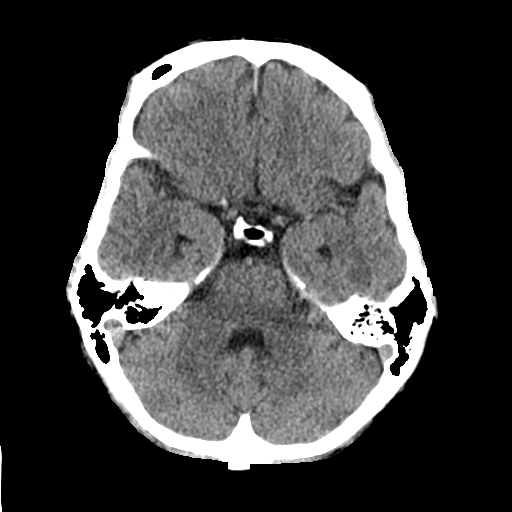
[im 12/34  brain]
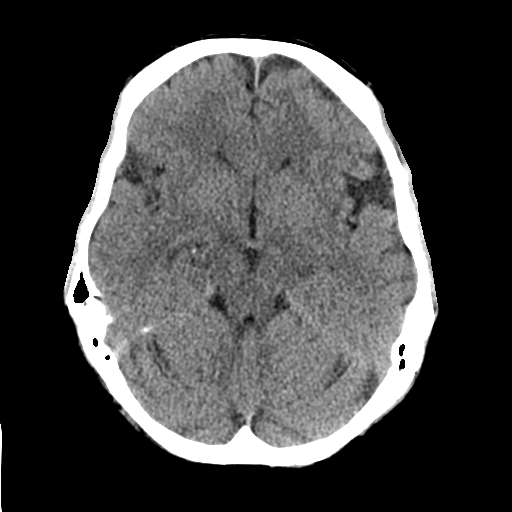
[im 15/34  brain]
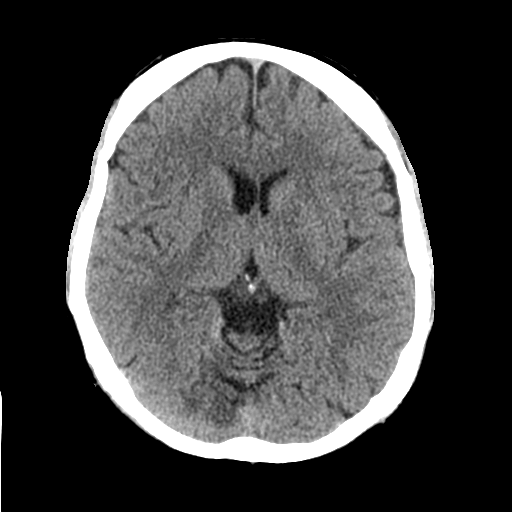
[im 15/34  bone]
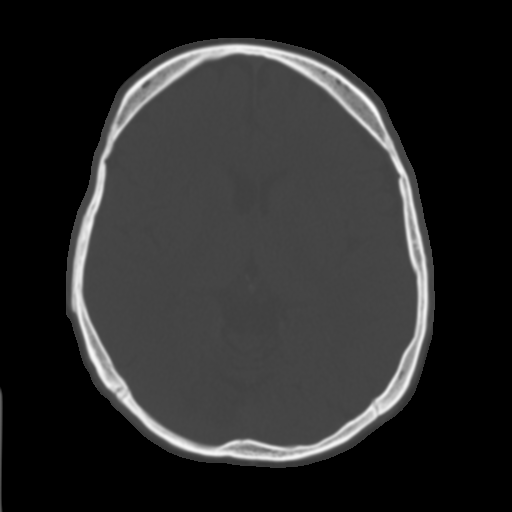
[im 19/34  brain]
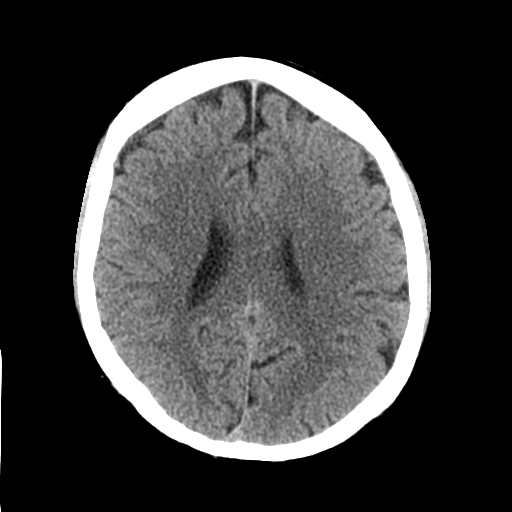
[im 22/34  brain]
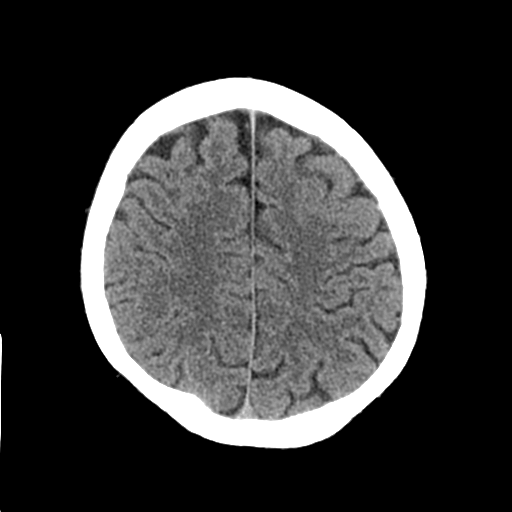
[im 26/34  brain]
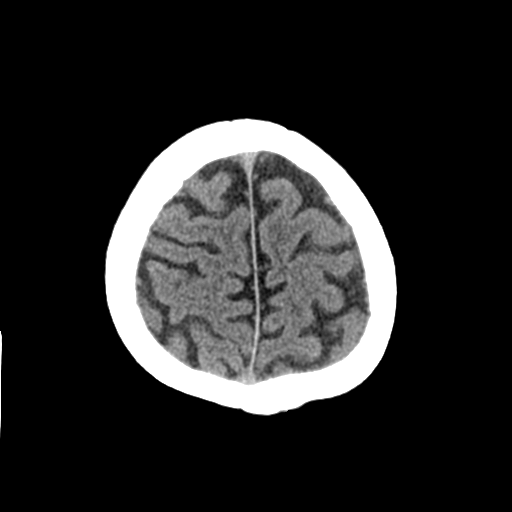
[im 28/34  brain]
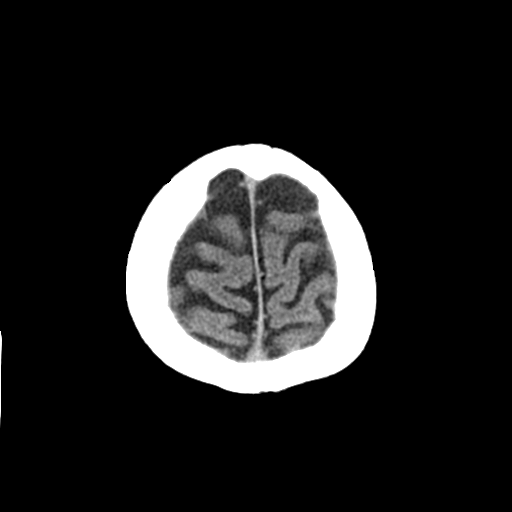
[im 28/34  bone]
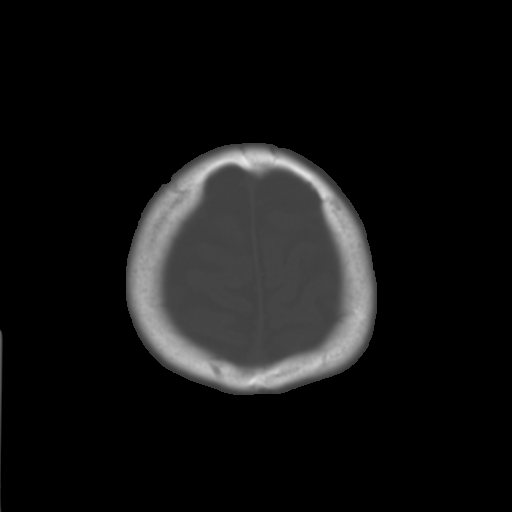
[im 31/34  brain]
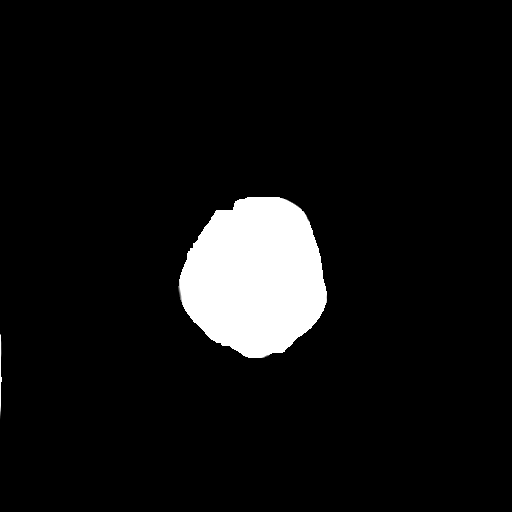

[Series 4: coronal soft tissue · coronal · 0.36mm/px · 3 of 69 slices shown]
[im 23/69  brain]
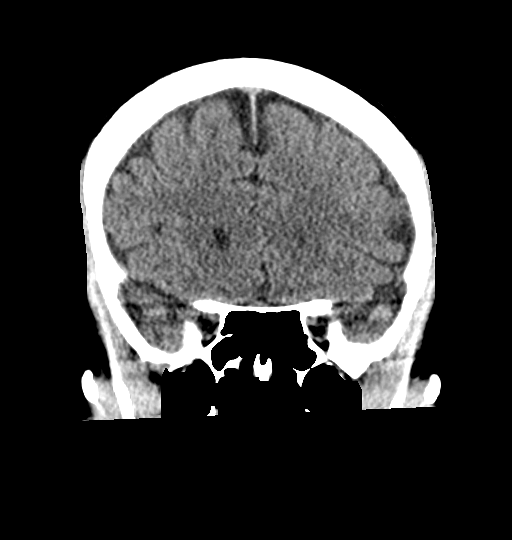
[im 31/69  brain]
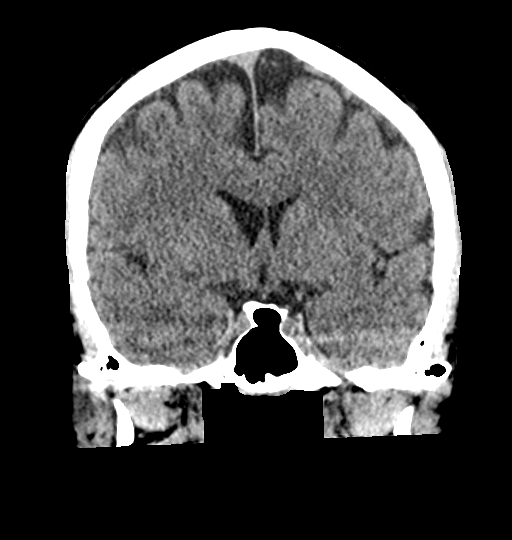
[im 38/69  brain]
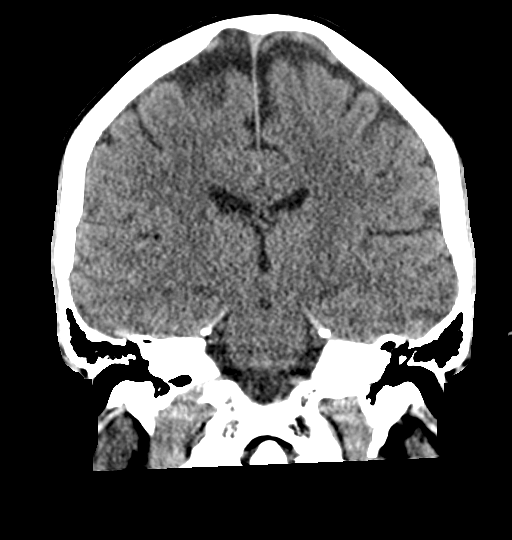

[Series 5: sagittal soft tissue · sagittal · 0.37mm/px · 3 of 61 slices shown]
[im 21/61  brain]
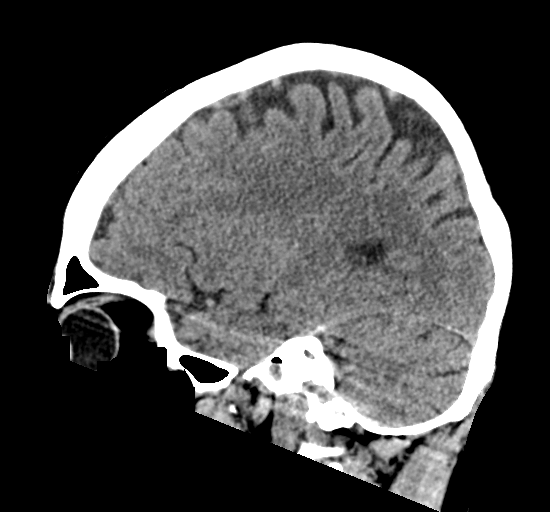
[im 31/61  brain]
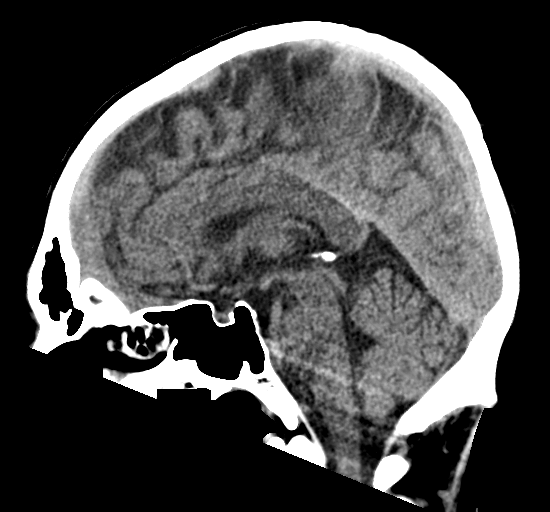
[im 41/61  brain]
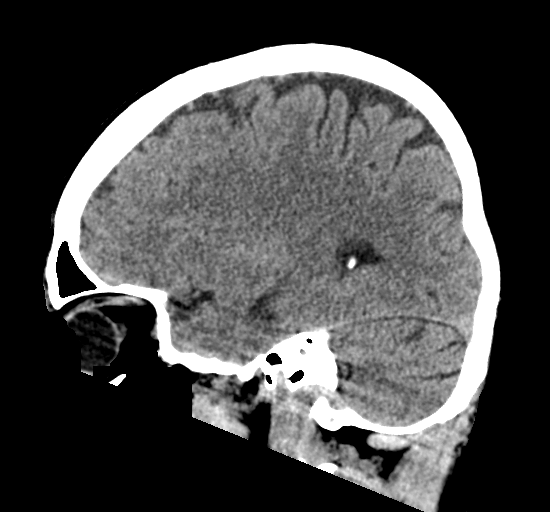

[16 of 47 positions shown; findings below may reference images not displayed]

FINDINGS: Brain: No evidence of acute infarction, hemorrhage, hydrocephalus,
extra-axial collection, or mass lesion/mass effect.

Vascular: No hyperdense vessel or unexpected calcification.

Skull: Normal. Negative for fracture or focal lesion.

Sinuses/Orbits: No acute finding.

Other: The mastoid air cells are well aerated.
IMPRESSION: No acute intracranial process.

## 2022-02-07 ENCOUNTER — Ambulatory Visit: Payer: Self-pay

## 2022-02-07 ENCOUNTER — Encounter: Payer: Self-pay | Admitting: Nurse Practitioner

## 2022-02-07 ENCOUNTER — Telehealth (INDEPENDENT_AMBULATORY_CARE_PROVIDER_SITE_OTHER): Payer: BC Managed Care – PPO | Admitting: Nurse Practitioner

## 2022-02-07 DIAGNOSIS — J029 Acute pharyngitis, unspecified: Secondary | ICD-10-CM | POA: Diagnosis not present

## 2022-02-07 MED ORDER — PREDNISONE 20 MG PO TABS: 40.0000 mg | ORAL_TABLET | Freq: Every day | ORAL | 0 refills | Status: AC

## 2022-02-07 MED ORDER — HYDROCOD POLI-CHLORPHE POLI ER 10-8 MG/5ML PO SUER
5.0000 mL | Freq: Two times a day (BID) | ORAL | 0 refills | Status: DC | PRN
Start: 2022-02-07 — End: 2023-11-11

## 2022-02-07 NOTE — Patient Instructions (Signed)

## 2022-02-07 NOTE — Progress Notes (Signed)
There were no vitals taken for this visit.   Subjective:    Patient ID: Jason Griffin, male    DOB: 1995-07-13, 26 y.o.   MRN: [id number]  HPI: Jason Griffin is a 26 y.o. male  Chief Complaint  Patient presents with   Covid Exposure    Exposed last week, Symptoms are eye pain, head ache, sore throat, cough, runny nose, chills, and some hot flashes.  Has been taking Ibuprofen and Vit. C   This visit was completed via video visit through MyChart due to the restrictions of the COVID-19 pandemic. All issues as above were discussed and addressed. Physical exam was done as above through visual confirmation on video through MyChart. If it was felt that the patient should be evaluated in the office, they were directed there. The patient verbally consented to this visit. Location of the patient: home Location of the provider: work Those involved with this call:  Provider: Aura Dials, DNP CMA: Malen Gauze, CMA Front Desk/Registration: [employer]  Time spent on call:  21 minutes with patient face to face via video conference. More than 50% of this time was spent in counseling and coordination of care. 15 minutes total spent in review of patient's record and preparation of their chart.  I verified patient identity using two factors (patient name and date of birth). Patient consents verbally to being seen via telemedicine visit today.    COVID EXPOSURE He reports he had some exposure to Covid last week at work.  He started feeling bad on Friday.  Has not tested for Covid at home.  Has not been Covid vaccinated.   Fever:  body aches Cough: yes Shortness of breath: no Wheezing: occasional Chest pain: no Chest tightness: no Chest congestion: no Nasal congestion: yes Runny nose: yes Post nasal drip: no Sneezing: no Sore throat: yes Swollen glands: no Sinus pressure: no Headache: yes Face pain: no Toothache: no Ear pain: none Ear pressure: none Eyes  red/itching:no Eye drainage/crusting: no  Vomiting: nausea and one episode of emesis Rash: no Fatigue: yes Sick contacts: yes at work Strep contacts: no  Context: stable Recurrent sinusitis: no Relief with OTC cold/cough medications: yes  Treatments attempted: Ibuprofen and Vitamin C    Relevant past medical, surgical, family and social history reviewed and updated as indicated. Interim medical history since our last visit reviewed. Allergies and medications reviewed and updated.  Review of Systems  Constitutional:  Positive for chills and fatigue. Negative for activity change, appetite change, diaphoresis and fever.  HENT:  Positive for congestion, postnasal drip, rhinorrhea, sinus pressure and sore throat. Negative for ear discharge, ear pain and voice change.   Respiratory:  Positive for cough, chest tightness and wheezing. Negative for shortness of breath.   Cardiovascular:  Negative for chest pain, palpitations and leg swelling.  Gastrointestinal: Negative.   Neurological:  Positive for headaches. Negative for dizziness and weakness.    Per HPI unless specifically indicated above     Objective:    There were no vitals taken for this visit.  Wt Readings from Last 3 Encounters:  01/28/21 171 lb 2 oz (77.6 kg)  01/21/21 178 lb (80.7 kg)  12/24/20 178 lb 9.6 oz (81 kg)    Physical Exam Vitals and nursing note reviewed.  Constitutional:      General: He is awake. He is not in acute distress.    Appearance: He is well-developed. He is not ill-appearing.  HENT:     Head: Normocephalic.  Right Ear: Hearing normal. No drainage.     Left Ear: Hearing normal. No drainage.  Eyes:     General: Lids are normal.        Right eye: No discharge.        Left eye: No discharge.     Conjunctiva/sclera: Conjunctivae normal.  Pulmonary:     Effort: Pulmonary effort is normal. No accessory muscle usage or respiratory distress.  Musculoskeletal:     Cervical back: Normal range of  motion.  Neurological:     Mental Status: He is alert and oriented to person, place, and time.  Psychiatric:        Mood and Affect: Mood normal.        Behavior: Behavior normal. Behavior is cooperative.        Thought Content: Thought content normal.        Judgment: Judgment normal.     Results for orders placed or performed in visit on 09/09/19  [id number] 11+Oxyco+Alc+Crt-Bund  Result Value Ref Range   Ethanol Negative Cutoff=0.020 %   Amphetamines, Urine See Final Results Cutoff=1000 ng/mL   Barbiturate Negative Cutoff=200 ng/mL   BENZODIAZ UR QL Negative Cutoff=200 ng/mL   Cannabinoid Quant, Ur See Final Results Cutoff=50 ng/mL   Cocaine (Metabolite) See Final Results Cutoff=300 ng/mL   OPIATE SCREEN URINE Negative Cutoff=300 ng/mL   Oxycodone/Oxymorphone, Urine Negative Cutoff=300 ng/mL   Phencyclidine Negative Cutoff=25 ng/mL   Methadone Screen, Urine Negative Cutoff=300 ng/mL   Propoxyphene Negative Cutoff=300 ng/mL   Meperidine Negative Cutoff=200 ng/mL   Tramadol Negative Cutoff=200 ng/mL   Creatinine 278.7 20.0 - 300.0 mg/dL   pH, Urine 6.0 4.5 - 8.9  Drug Profile [phone]  Result Value Ref Range   Amphetamines Positive (A) Cutoff=1000   Amphetamine Positive (A)    Amphetamine GC/MS Conf 5,820 Cutoff=500 ng/mL   Methamphetamine Negative Cutoff=500  Cannabinoid Conf, Ur  Result Value Ref Range   CANNABINOIDS Positive (A) Cutoff=50   Carboxy THC GC/MS Conf >300 Cutoff=15 ng/mL  Cocaine Con, Ur  Result Value Ref Range   Cocaine Metab Quant, Ur Positive (A) Cutoff=300   Benzoylecgonine GC/MS Conf 1,182 Cutoff=150 ng/mL      Assessment & Plan:   Problem List Items Addressed This Visit       Other   Sore throat - Primary    Acute with exposure to Covid last week, has not tested at home.  Will obtain test outpatient at office today.  Recommend self quarantine until results return.  Not vaccinated.  Script for Prednisone and Tussionex sent in.  Recommend: -  Increased rest - Increasing Fluids - Acetaminophen / ibuprofen as needed for fever/pain.  - Salt water gargling, chloraseptic spray and throat lozenges - Mucinex.  - Humidifying the air. Return to office as needed if worsening or ongoing.      Relevant Orders   Novel Coronavirus, NAA ([facility])    I discussed the assessment and treatment plan with the patient. The patient was provided an opportunity to ask questions and all were answered. The patient agreed with the plan and demonstrated an understanding of the instructions.   The patient was advised to call back or seek an in-person evaluation if the symptoms worsen or if the condition fails to improve as anticipated.   I provided 21+ minutes of time during this encounter.   Follow up plan: Return if symptoms worsen or fail to improve.

## 2022-02-07 NOTE — Assessment & Plan Note (Signed)
Acute with exposure to Covid last week, has not tested at home.  Will obtain test outpatient at office today.  Recommend self quarantine until results return.  Not vaccinated.  Script for Prednisone and Tussionex sent in.  Recommend: - Increased rest - Increasing Fluids - Acetaminophen / ibuprofen as needed for fever/pain.  - Salt water gargling, chloraseptic spray and throat lozenges - Mucinex.  - Humidifying the air. Return to office as needed if worsening or ongoing.

## 2022-02-07 NOTE — Telephone Encounter (Signed)
Pts mother is calling to report the patient is sick with coughing and congestion. Pt is needing a doctors note to return back to work. No available appts today. Please advise     Chief Complaint: Cough, congestion x 3 days Symptoms: Above Frequency: 3 days ago Pertinent Negatives: Patient denies SOB Disposition: [] ED /[] Urgent Care (no appt availability in office) / [x] Appointment(In office/virtual)/ []  [facility]/ [] Home Care/ [] Refused Recommended Disposition /[] [place] [facility]/ []  Follow-up with PCP Additional Notes: Virtual visit today per in the practice.  Reason for Disposition  [1] Continuous (nonstop) coughing interferes with work or school AND [2] no improvement using cough treatment per Care Advice  Answer Assessment - Initial Assessment Questions 1. ONSET: "When did the cough begin?"      Weekend 2. SEVERITY: "How bad is the cough today?"      SEvere 3. SPUTUM: "Describe the color of your sputum" (none, dry cough; clear, white, yellow, green)     None 4. HEMOPTYSIS: "Are you coughing up any blood?" If so ask: "How much?" (flecks, streaks, tablespoons, etc.)     No 5. DIFFICULTY BREATHING: "Are you having difficulty breathing?" If Yes, ask: "How bad is it?" (e.g., mild, moderate, severe)    - MILD: No SOB at rest, mild SOB with walking, speaks normally in sentences, can lie down, no retractions, pulse < 100.    - MODERATE: SOB at rest, SOB with minimal exertion and prefers to sit, cannot lie down flat, speaks in phrases, mild retractions, audible wheezing, pulse 100-120.    - SEVERE: Very SOB at rest, speaks in single words, struggling to breathe, sitting hunched forward, retractions, pulse > 120      No 6. FEVER: "Do you have a fever?" If Yes, ask: "What is your temperature, how was it measured, and when did it start?"     Chills 7. CARDIAC HISTORY: "Do you have any history of heart disease?" (e.g., heart attack, congestive heart failure)       No 8. LUNG HISTORY: "Do you have any history of lung disease?"  (e.g., pulmonary embolus, asthma, emphysema)     No 9. PE RISK FACTORS: "Do you have a history of blood clots?" (or: recent major surgery, recent prolonged travel, bedridden)     No 10. OTHER SYMPTOMS: "Do you have any other symptoms?" (e.g., runny nose, wheezing, chest pain)       Vomited yesterday 11. PREGNANCY: "Is there any chance you are pregnant?" "When was your last menstrual period?"       N/a 12. TRAVEL: "Have you traveled out of the country in the last month?" (e.g., travel history, exposures)       No  Protocols used: Cough - Acute Non-Productive-A-AH

## 2022-02-08 LAB — NOVEL CORONAVIRUS, NAA: SARS-CoV-2, NAA: NOT DETECTED

## 2022-02-08 NOTE — Progress Notes (Signed)
Contacted via MyChart   Good evening, Covid is negative!!  Woohoo!!  Continue current treatment sent in.  Any questions?  If worsening or ongoing symptoms over next 5 days then let us know.

## 2022-02-09 ENCOUNTER — Telehealth: Payer: Self-pay

## 2022-02-09 NOTE — Telephone Encounter (Signed)
Mother given lab results, verbalizes understanding.

## 2023-06-22 ENCOUNTER — Ambulatory Visit: Payer: Self-pay | Admitting: *Deleted

## 2023-06-22 ENCOUNTER — Telehealth: Payer: Self-pay | Admitting: Nurse Practitioner

## 2023-06-22 DIAGNOSIS — R6889 Other general symptoms and signs: Secondary | ICD-10-CM

## 2023-06-22 MED ORDER — OSELTAMIVIR PHOSPHATE 75 MG PO CAPS: 75.0000 mg | ORAL_CAPSULE | Freq: Two times a day (BID) | ORAL | 0 refills | Status: AC

## 2023-06-22 MED ORDER — ONDANSETRON 4 MG PO TBDP: 4.0000 mg | ORAL_TABLET | Freq: Three times a day (TID) | ORAL | 0 refills | Status: DC | PRN

## 2023-06-22 NOTE — Telephone Encounter (Signed)
  Chief Complaint: flu like symptoms- cough, weakness, chills, muscle aches, headache, sore throat Symptoms: see above Frequency: 2 days Pertinent Negatives: Patient denies SOB Disposition: [] ED /[] Urgent Care (no appt availability in office) / [] Appointment(In office/virtual)/ [x]  [facility]/ [] Home Care/ [] Refused Recommended Disposition /[] [place] [facility]/ []  Follow-up with PCP Additional Notes: Patient has been scheduled with [facility]- he wanted first available appointment.

## 2023-06-22 NOTE — Telephone Encounter (Signed)
 FYI: [facility] appointment scheduled

## 2023-06-22 NOTE — Progress Notes (Signed)
 Virtual Visit Consent   Jason Griffin, you are scheduled for a virtual visit with a [facility] provider today. Just as with appointments in the office, your consent must be obtained to participate. Your consent will be active for this visit and any virtual visit you may have with one of our providers in the next 365 days. If you have a MyChart account, a copy of this consent can be sent to you electronically.  As this is a virtual visit, video technology does not allow for your provider to perform a traditional examination. This may limit your provider's ability to fully assess your condition. If your provider identifies any concerns that need to be evaluated in person or the need to arrange testing (such as labs, EKG, etc.), we will make arrangements to do so. Although advances in technology are sophisticated, we cannot ensure that it will always work on either your end or our end. If the connection with a video visit is poor, the visit may have to be switched to a telephone visit. With either a video or telephone visit, we are not always able to ensure that we have a secure connection.  By engaging in this virtual visit, you consent to the provision of healthcare and authorize for your insurance to be billed (if applicable) for the services provided during this visit. Depending on your insurance coverage, you may receive a charge related to this service.  I need to obtain your verbal consent now. Are you willing to proceed with your visit today? [name] has provided verbal consent on 06/22/2023 for a virtual visit (video or telephone). [name], FNP  Date: 06/22/2023 4:34 PM  Virtual Visit via Video Note   I, [name], connected with  RAMBO SARAFIAN  ([id number], Mar 07, 1996) on 06/22/23 at  4:45 PM EST by a video-enabled telemedicine application and verified that I am speaking with the correct person using two identifiers.  Location: Patient: Virtual Visit Location  Patient: Home Provider: Virtual Visit Location Provider: Home Office   I discussed the limitations of evaluation and management by telemedicine and the availability of in person appointments. The patient expressed understanding and agreed to proceed.    History of Present Illness: Jason Griffin is a 28 y.o. 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 (Patient not taking: Reported on 02/07/2022), Disp: 30 capsule, Rfl: 0   chlorpheniramine-HYDROcodone (TUSSIONEX) 10-8 MG/5ML, Take 5 mLs by mouth every 12 (twelve) hours as needed for cough., Disp: 70 mL, Rfl: 0   meloxicam  (MOBIC ) 7.5 MG tablet, Take 1 tablet (7.5 mg total) by mouth daily. (Patient not taking: Reported  on 02/07/2022), Disp: 30 tablet, Rfl: 0   methocarbamol  (ROBAXIN ) 500 MG tablet, Take 1 tablet (500 mg total) by mouth every 8 (eight) hours as needed for muscle spasms. (Patient not taking: Reported on 02/07/2022), Disp: 60 tablet, Rfl: 4   sertraline  (ZOLOFT ) 100 MG tablet, TAKE 1 TABLET BY MOUTH EVERY DAY, Disp: 90 tablet, Rfl: 1  Observations/Objective: Patient is well-developed, well-nourished  in no acute distress.  Resting comfortably  at home.  Head is normocephalic, atraumatic.  No labored breathing.  Speech is clear and coherent with logical content.  Patient is alert and oriented at baseline.    Assessment and Plan:   1. Flu-like symptoms (Primary)  Advised starting with zofran , then fluids, then bland foods  When food is tolerated start Tamiflu  as tolerated  Continue to manage symptoms with OTC medications as discussed    - ondansetron  (ZOFRAN -ODT) 4 MG disintegrating tablet; Take 1 tablet (4 mg total) by mouth every 8 (eight) hours as needed.  Dispense: 20 tablet; Refill: 0 - oseltamivir  (TAMIFLU ) 75 MG capsule; Take 1 capsule (75 mg total) by mouth 2 (two) times daily for 5 days.  Dispense: 10 capsule; Refill: 0     Follow Up Instructions: I discussed the assessment and treatment plan with the patient. The patient was provided an opportunity to ask questions and all were answered. The patient agreed with the plan and demonstrated an understanding of the instructions.  A copy of instructions were sent to the patient via MyChart unless otherwise noted below.    The patient was advised to call back or seek an in-person evaluation if the symptoms worsen or if the condition fails to improve as anticipated.    [name], FNP

## 2023-06-22 NOTE — Telephone Encounter (Signed)
 Summary: body aches, cough, chills.   Olam stated the patient has been sick. In the bed for two days. With body aches, cough, chills.  She asked that pt have a virtual visit today. No appointments available.  Please advise.  Please call pt at - [phone]     Reason for Disposition  SEVERE coughing spells (e.g., whooping sound after coughing, vomiting after coughing)  Answer Assessment - Initial Assessment Questions 1. WORST SYMPTOM: What is your worst symptom? (e.g., cough, runny nose, muscle aches, headache, sore throat, fever)      Cough, body ache, weakness 2. ONSET: When did your flu symptoms start?      2 days ago 3. COUGH: How bad is the cough?       Dry cough 4. RESPIRATORY DISTRESS: Describe your breathing.      no 5. FEVER: Do you have a fever? If Yes, ask: What is your temperature, how was it measured, and when did it start?     Chills, hot 6. EXPOSURE: Were you exposed to someone with influenza?       no 7. FLU VACCINE: Did you get a flu shot this year?     no 8. HIGH RISK DISEASE: Do you have any chronic medical problems? (e.g., heart or lung disease, asthma, weak immune system, or other HIGH RISK conditions)     no  10. OTHER SYMPTOMS: Do you have any other symptoms?  (e.g., runny nose, muscle aches, headache, sore throat)       Muscle aches, fatigue, headache, sore throat  Protocols used: Influenza - Seasonal-A-AH, Cough - Acute Non-Productive-A-AH

## 2023-06-29 ENCOUNTER — Encounter: Payer: Self-pay | Admitting: Nurse Practitioner

## 2023-11-02 ENCOUNTER — Encounter: Payer: Self-pay | Admitting: Nurse Practitioner

## 2023-11-11 NOTE — Patient Instructions (Signed)
 Acute Back Pain, Adult Acute back pain is sudden and usually short-lived. It is often caused by an injury to the muscles and tissues in the back. The injury may result from: A muscle, tendon, or ligament getting overstretched or torn. Ligaments are tissues that connect bones to each other. Lifting something improperly can cause a back strain. Wear and tear (degeneration) of the spinal disks. Spinal disks are circular tissue that provide cushioning between the bones of the spine (vertebrae). Twisting motions, such as while playing sports or doing yard work. A hit to the back. Arthritis. You may have a physical exam, lab tests, and imaging tests to find the cause of your pain. Acute back pain usually goes away with rest and home care. Follow these instructions at home: Managing pain, stiffness, and swelling Take over-the-counter and prescription medicines only as told by your health care provider. Treatment may include medicines for pain and inflammation that are taken by mouth or applied to the skin, or muscle relaxants. Your health care provider may recommend applying ice during the first 24-48 hours after your pain starts. To do this: Put ice in a plastic bag. Place a towel between your skin and the bag. Leave the ice on for 20 minutes, 2-3 times a day. Remove the ice if your skin turns bright red. This is very important. If you cannot feel pain, heat, or cold, you have a greater risk of damage to the area. If directed, apply heat to the affected area as often as told by your health care provider. Use the heat source that your health care provider recommends, such as a moist heat pack or a heating pad. Place a towel between your skin and the heat source. Leave the heat on for 20-30 minutes. Remove the heat if your skin turns bright red. This is especially important if you are unable to feel pain, heat, or cold. You have a greater risk of getting burned. Activity  Do not stay in bed. Staying in  bed for more than 1-2 days can delay your recovery. Sit up and stand up straight. Avoid leaning forward when you sit or hunching over when you stand. If you work at a desk, sit close to it so you do not need to lean over. Keep your chin tucked in. Keep your neck drawn back, and keep your elbows bent at a 90-degree angle (right angle). Sit high and close to the steering wheel when you drive. Add lower back (lumbar) support to your car seat, if needed. Take short walks on even surfaces as soon as you are able. Try to increase the length of time you walk each day. Do not sit, drive, or stand in one place for more than 30 minutes at a time. Sitting or standing for long periods of time can put stress on your back. Do not drive or use heavy machinery while taking prescription pain medicine. Use proper lifting techniques. When you bend and lift, use positions that put less stress on your back: [place] your knees. Keep the load close to your body. Avoid twisting. Exercise regularly as told by your health care provider. Exercising helps your back heal faster and helps prevent back injuries by keeping muscles strong and flexible. Work with a physical therapist to make a safe exercise program, as recommended by your health care provider. Do any exercises as told by your physical therapist. Lifestyle Maintain a healthy weight. Extra weight puts stress on your back and makes it difficult to have good  posture. Avoid activities or situations that make you feel anxious or stressed. Stress and anxiety increase muscle tension and can make back pain worse. Learn ways to manage anxiety and stress, such as through exercise. General instructions Sleep on a firm mattress in a comfortable position. Try lying on your side with your knees slightly bent. If you lie on your back, put a pillow under your knees. Keep your head and neck in a straight line with your spine (neutral position) when using electronic equipment like  smartphones or pads. To do this: Raise your smartphone or pad to look at it instead of bending your head or neck to look down. Put the smartphone or pad at the level of your face while looking at the screen. Follow your treatment plan as told by your health care provider. This may include: Cognitive or behavioral therapy. Acupuncture or massage therapy. Meditation or yoga. Contact a health care provider if: You have pain that is not relieved with rest or medicine. You have increasing pain going down into your legs or buttocks. Your pain does not improve after 2 weeks. You have pain at night. You lose weight without trying. You have a fever or chills. You develop nausea or vomiting. You develop abdominal pain. Get help right away if: You develop new bowel or bladder control problems. You have unusual weakness or numbness in your arms or legs. You feel faint. These symptoms may represent a serious problem that is an emergency. Do not wait to see if the symptoms will go away. Get medical help right away. Call your local emergency services (911 in the [place] Do not drive yourself to the hospital. Summary Acute back pain is sudden and usually short-lived. Use proper lifting techniques. When you bend and lift, use positions that put less stress on your back. Take over-the-counter and prescription medicines only as told by your health care provider, and apply heat or ice as told. This information is not intended to replace advice given to you by your health care provider. Make sure you discuss any questions you have with your health care provider. Document Revised: 08/21/2020 Document Reviewed: 08/21/2020 Elsevier Patient Education  2024 [employer].

## 2023-11-15 ENCOUNTER — Encounter: Payer: Self-pay | Admitting: Nurse Practitioner

## 2023-11-15 ENCOUNTER — Ambulatory Visit (INDEPENDENT_AMBULATORY_CARE_PROVIDER_SITE_OTHER): Payer: Self-pay | Admitting: Nurse Practitioner

## 2023-11-15 VITALS — BP 105/71 | HR 77 | Temp 98.1°F | Ht 70.8 in | Wt 171.0 lb

## 2023-11-15 DIAGNOSIS — M5442 Lumbago with sciatica, left side: Secondary | ICD-10-CM

## 2023-11-15 MED ORDER — PREDNISONE 10 MG PO TABS: ORAL_TABLET | ORAL | 0 refills | Status: DC

## 2023-11-15 NOTE — Progress Notes (Signed)
 BP 105/71   Pulse 77   Temp 98.1 F (36.7 C) (Oral)   Ht 5' 10.8" (1.798 m)   Wt 171 lb (77.6 kg)   SpO2 98%   BMI 23.98 kg/m    Subjective:    Patient ID: Jason Griffin, male    DOB: 1995-08-13, 28 y.o.   MRN: [id number]  HPI: Jason Griffin is a 28 y.o. male  Chief Complaint  Patient presents with   Back Pain    Patient states he has been having off and on left sided low back pain for the last 2 to 3 weeks. States he has injured this area before but not recently. States he just woke up and his back was hurting. States that his back hurts more with movement and activity. Takes Advil for the pain, states this does not help much.    BACK PAIN Started 2 1/2 weeks, no injuries prior to this.  Woke up one day with pain.  Earlier that week he had done heavier lifting.  Lifts items off trucks. Duration: weeks Mechanism of injury: unknown Location: Left and low back Onset: gradual Severity: 5/10 Quality: sharp, dull, aching, and throbbing Frequency: constant Radiation: L leg below the knee Aggravating factors: lifting, movement, and bending Alleviating factors: nothing Status: fluctuating Treatments attempted: Advil, rest  Relief with NSAIDs?: mild Nighttime pain:  no Paresthesias / decreased sensation:  no Bowel / bladder incontinence:  no Fevers:  no Dysuria / urinary frequency:  no   Relevant past medical, surgical, family and social history reviewed and updated as indicated. Interim medical history since our last visit reviewed. Allergies and medications reviewed and updated.  Review of Systems  Constitutional:  Negative for activity change, appetite change, diaphoresis, fatigue and fever.  Respiratory:  Negative for cough, chest tightness, shortness of breath and wheezing.   Cardiovascular:  Negative for chest pain, palpitations and leg swelling.  Gastrointestinal: Negative.   Musculoskeletal:  Positive for back pain.  Neurological: Negative.    Psychiatric/Behavioral: Negative.      Per HPI unless specifically indicated above     Objective:     BP 105/71   Pulse 77   Temp 98.1 F (36.7 C) (Oral)   Ht 5' 10.8" (1.798 m)   Wt 171 lb (77.6 kg)   SpO2 98%   BMI 23.98 kg/m   Wt Readings from Last 3 Encounters:  11/15/23 171 lb (77.6 kg)  01/28/21 171 lb 2 oz (77.6 kg)  01/21/21 178 lb (80.7 kg)    Physical Exam Vitals and nursing note reviewed.  Constitutional:      General: He is awake. He is not in acute distress.    Appearance: He is well-developed and well-groomed. He is not ill-appearing or toxic-appearing.  HENT:     Head: Normocephalic.     Right Ear: Hearing and external ear normal.     Left Ear: Hearing and external ear normal.  Eyes:     General: Lids are normal.     Extraocular Movements: Extraocular movements intact.     Conjunctiva/sclera: Conjunctivae normal.  Neck:     Thyroid: No thyromegaly.     Vascular: No carotid bruit.  Cardiovascular:     Rate and Rhythm: Normal rate and regular rhythm.     Heart sounds: Normal heart sounds. No murmur heard.    No gallop.  Pulmonary:     Effort: Pulmonary effort is normal. No accessory muscle usage or respiratory distress.  Breath sounds: Normal breath sounds. No decreased breath sounds, wheezing or rales.  Abdominal:     General: Bowel sounds are normal. There is no distension.     Palpations: Abdomen is soft.     Tenderness: There is no abdominal tenderness.  Musculoskeletal:     Cervical back: Full passive range of motion without pain.     Lumbar back: No swelling, spasms or tenderness. Decreased range of motion. Negative right straight leg raise test.     Right lower leg: No edema.     Left lower leg: No edema.     Comments: Decreased flexion and extension.  Able to get palms to knees before pain started.  Pain with rotation left and lateral left.  Lymphadenopathy:     Cervical: No cervical adenopathy.  Skin:    General: Skin is warm.      Capillary Refill: Capillary refill takes less than 2 seconds.     Findings: No rash.  Neurological:     Mental Status: He is alert and oriented to person, place, and time.     Deep Tendon Reflexes: Reflexes are normal and symmetric.     Reflex Scores:      Brachioradialis reflexes are 2+ on the right side and 2+ on the left side.      Patellar reflexes are 2+ on the right side and 2+ on the left side. Psychiatric:        Attention and Perception: Attention normal.        Mood and Affect: Mood normal.        Speech: Speech normal.        Behavior: Behavior normal. Behavior is cooperative.        Thought Content: Thought content normal.     Results for orders placed or performed in visit on 02/07/22  Novel Coronavirus, NAA ([facility])   Collection Time: 02/07/22  4:31 PM   Specimen: Nasopharyngeal(NP) swabs in vial transport medium  Result Value Ref Range   SARS-CoV-2, NAA Not Detected Not Detected      Assessment & Plan:   Problem List Items Addressed This Visit       Other   Acute back pain - Primary   Acute for a few weeks, no recent injuries.  No red flags on exam.  Is self pay.  Will send in Prednisone  taper which should offer benefit to discomfort. May continue OTC Advil and recommend using Icy/Hot Lidocaine  patches.  Would benefit heating pad as needed.  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      Relevant Medications   predniSONE  (DELTASONE ) 10 MG tablet     Follow up plan: Return if symptoms worsen or fail to improve.

## 2023-11-15 NOTE — Assessment & Plan Note (Signed)
 Acute for a few weeks, no recent injuries.  No red flags on exam.  Is self pay.  Will send in Prednisone  taper which should offer benefit to discomfort. May continue OTC Advil and recommend using Icy/Hot Lidocaine  patches.  Would benefit heating pad as needed.  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

## 2024-01-19 ENCOUNTER — Encounter: Payer: Self-pay | Admitting: Nurse Practitioner

## 2024-02-25 NOTE — Patient Instructions (Incomplete)
 Healthy Eating, Adult Healthy eating may help you get and keep a healthy body weight, reduce the risk of chronic disease, and live a long and productive life. It is important to follow a healthy eating pattern. Your nutritional and calorie needs should be met mainly by different nutrient-rich foods. What are tips for following this plan? Reading food labels Read labels and choose the following: Reduced or low sodium products. Juices with 100% fruit juice. Foods with low saturated fats (<3 g per serving) and high polyunsaturated and monounsaturated fats. Foods with whole grains, such as whole wheat, cracked wheat, brown rice, and wild rice. Whole grains that are fortified with folic acid . This is recommended for females who are pregnant or who want to become pregnant. Read labels and do not eat or drink the following: Foods or drinks with added sugars. These include foods that contain brown sugar, corn sweetener, corn syrup, dextrose , fructose, glucose, high-fructose corn syrup, honey, invert sugar, lactose, malt syrup, maltose, molasses, raw sugar, sucrose, trehalose, or turbinado sugar. Limit your intake of added sugars to less than 10% of your total daily calories. Do not eat more than the following amounts of added sugar per day: 6 teaspoons (25 g) for females. 9 teaspoons (38 g) for males. Foods that contain processed or refined starches and grains. Refined grain products, such as white flour, degermed cornmeal, white bread, and white rice. Shopping Choose nutrient-rich snacks, such as vegetables, whole fruits, and nuts. Avoid high-calorie and high-sugar snacks, such as potato chips, fruit snacks, and candy. Use oil-based dressings and spreads on foods instead of solid fats such as butter, margarine, sour cream, or cream cheese. Limit pre-made sauces, mixes, and instant products such as flavored rice, instant noodles, and ready-made pasta. Try more plant-protein sources, such as tofu,  tempeh, black beans, edamame, lentils, nuts, and seeds. Explore eating plans such as the Mediterranean diet or vegetarian diet. Try heart-healthy dips made with beans and healthy fats like hummus and guacamole. Vegetables go great with these. Cooking Use oil to saut or stir-fry foods instead of solid fats such as butter, margarine, or lard. Try baking, boiling, grilling, or broiling instead of frying. Remove the fatty part of meats before cooking. Steam vegetables in water  or broth. Meal planning  At meals, imagine dividing your plate into fourths: One-half of your plate is fruits and vegetables. One-fourth of your plate is whole grains. One-fourth of your plate is protein, especially lean meats, poultry, eggs, tofu, beans, or nuts. Include low-fat dairy as part of your daily diet. Lifestyle Choose healthy options in all settings, including home, work, school, restaurants, or stores. Prepare your food safely: Wash your hands after handling raw meats. Where you prepare food, keep surfaces clean by regularly washing with hot, soapy water . Keep raw meats separate from ready-to-eat foods, such as fruits and vegetables. Cook seafood, meat, poultry, and eggs to the recommended temperature. Get a food thermometer. Store foods at safe temperatures. In general: Keep cold foods at 34F (4.4C) or below. Keep hot foods at 134F (60C) or above. Keep your freezer at [facility] (-17.8C) or below. Foods are not safe to eat if they have been between the temperatures of 40-134F (4.4-60C) for more than 2 hours. What foods should I eat? Fruits Aim to eat 1-2 cups of fresh, canned (in natural juice), or frozen fruits each day. One cup of fruit equals 1 small apple, 1 large banana, 8 large strawberries, 1 cup (237 g) canned fruit,  cup (82 g) dried fruit,  or 1 cup (240 mL) 100% juice. Vegetables Aim to eat 2-4 cups of fresh and frozen vegetables each day, including different varieties and colors. One cup  of vegetables equals 1 cup (91 g) broccoli or cauliflower florets, 2 medium carrots, 2 cups (150 g) raw, leafy greens, 1 large tomato, 1 large bell pepper, 1 large sweet potato, or 1 medium white potato. Grains Aim to eat 5-10 ounce-equivalents of whole grains each day. Examples of 1 ounce-equivalent of grains include 1 slice of bread, 1 cup (40 g) ready-to-eat cereal, 3 cups (24 g) popcorn, or  cup (93 g) cooked rice. Meats and other proteins Try to eat 5-7 ounce-equivalents of protein each day. Examples of 1 ounce-equivalent of protein include 1 egg,  oz nuts (12 almonds, 24 pistachios, or 7 walnut halves), 1/4 cup (90 g) cooked beans, 6 tablespoons (90 g) hummus or 1 tablespoon (16 g) peanut butter. A cut of meat or fish that is the size of a deck of cards is about 3-4 ounce-equivalents (85 g). Of the protein you eat each week, try to have at least 8 sounce (227 g) of seafood. This is about 2 servings per week. This includes salmon, trout, herring, sardines, and anchovies. Dairy Aim to eat 3 cup-equivalents of fat-free or low-fat dairy each day. Examples of 1 cup-equivalent of dairy include 1 cup (240 mL) milk, 8 ounces (250 g) yogurt, 1 ounces (44 g) natural cheese, or 1 cup (240 mL) fortified soy milk. Fats and oils Aim for about 5 teaspoons (21 g) of fats and oils per day. Choose monounsaturated fats, such as canola and olive oils, mayonnaise made with olive oil or avocado oil, avocados, peanut butter, and most nuts, or polyunsaturated fats, such as sunflower, corn, and soybean oils, walnuts, pine nuts, sesame seeds, sunflower seeds, and flaxseed. Beverages Aim for 6 eight-ounce glasses of water  per day. Limit coffee to 3-5 eight-ounce cups per day. Limit caffeinated beverages that have added calories, such as soda and energy drinks. If you drink alcohol: Limit how much you have to: 0-1 drink a day if you are male. 0-2 drinks a day if you are male. Know how much alcohol is in your drink.  In the [place] one drink is one 12 oz bottle of beer (355 mL), one 5 oz glass of wine (148 mL), or one 1 oz glass of hard liquor (44 mL). Seasoning and other foods Try not to add too much salt to your food. Try using herbs and spices instead of salt. Try not to add sugar to food. This information is based on [place] nutrition guidelines. To learn more, visit [employer]. Exact amounts may vary. You may need different amounts. This information is not intended to replace advice given to you by your health care provider. Make sure you discuss any questions you have with your health care provider. Document Revised: 02/28/2022 Document Reviewed: 02/28/2022 Elsevier Patient Education  2024 [employer].

## 2024-02-28 ENCOUNTER — Ambulatory Visit: Payer: Self-pay | Admitting: Nurse Practitioner

## 2024-02-28 NOTE — Telephone Encounter (Signed)
 Ok for E2C2 to review.  Left message for patient. Please assist with rescheduling patient for appointment. If he has more immediate concerns please forward call to triage to assist. Otherwise ok to schedule with PCP.

## 2024-03-03 NOTE — Patient Instructions (Signed)
 Healthy Eating, Adult Healthy eating may help you get and keep a healthy body weight, reduce the risk of chronic disease, and live a long and productive life. It is important to follow a healthy eating pattern. Your nutritional and calorie needs should be met mainly by different nutrient-rich foods. What are tips for following this plan? Reading food labels Read labels and choose the following: Reduced or low sodium products. Juices with 100% fruit juice. Foods with low saturated fats (<3 g per serving) and high polyunsaturated and monounsaturated fats. Foods with whole grains, such as whole wheat, cracked wheat, brown rice, and wild rice. Whole grains that are fortified with folic acid . This is recommended for females who are pregnant or who want to become pregnant. Read labels and do not eat or drink the following: Foods or drinks with added sugars. These include foods that contain brown sugar, corn sweetener, corn syrup, dextrose , fructose, glucose, high-fructose corn syrup, honey, invert sugar, lactose, malt syrup, maltose, molasses, raw sugar, sucrose, trehalose, or turbinado sugar. Limit your intake of added sugars to less than 10% of your total daily calories. Do not eat more than the following amounts of added sugar per day: 6 teaspoons (25 g) for females. 9 teaspoons (38 g) for males. Foods that contain processed or refined starches and grains. Refined grain products, such as white flour, degermed cornmeal, white bread, and white rice. Shopping Choose nutrient-rich snacks, such as vegetables, whole fruits, and nuts. Avoid high-calorie and high-sugar snacks, such as potato chips, fruit snacks, and candy. Use oil-based dressings and spreads on foods instead of solid fats such as butter, margarine, sour cream, or cream cheese. Limit pre-made sauces, mixes, and instant products such as flavored rice, instant noodles, and ready-made pasta. Try more plant-protein sources, such as tofu,  tempeh, black beans, edamame, lentils, nuts, and seeds. Explore eating plans such as the Mediterranean diet or vegetarian diet. Try heart-healthy dips made with beans and healthy fats like hummus and guacamole. Vegetables go great with these. Cooking Use oil to saut or stir-fry foods instead of solid fats such as butter, margarine, or lard. Try baking, boiling, grilling, or broiling instead of frying. Remove the fatty part of meats before cooking. Steam vegetables in water  or broth. Meal planning  At meals, imagine dividing your plate into fourths: One-half of your plate is fruits and vegetables. One-fourth of your plate is whole grains. One-fourth of your plate is protein, especially lean meats, poultry, eggs, tofu, beans, or nuts. Include low-fat dairy as part of your daily diet. Lifestyle Choose healthy options in all settings, including home, work, school, restaurants, or stores. Prepare your food safely: Wash your hands after handling raw meats. Where you prepare food, keep surfaces clean by regularly washing with hot, soapy water . Keep raw meats separate from ready-to-eat foods, such as fruits and vegetables. Cook seafood, meat, poultry, and eggs to the recommended temperature. Get a food thermometer. Store foods at safe temperatures. In general: Keep cold foods at 34F (4.4C) or below. Keep hot foods at 134F (60C) or above. Keep your freezer at [facility] (-17.8C) or below. Foods are not safe to eat if they have been between the temperatures of 40-134F (4.4-60C) for more than 2 hours. What foods should I eat? Fruits Aim to eat 1-2 cups of fresh, canned (in natural juice), or frozen fruits each day. One cup of fruit equals 1 small apple, 1 large banana, 8 large strawberries, 1 cup (237 g) canned fruit,  cup (82 g) dried fruit,  or 1 cup (240 mL) 100% juice. Vegetables Aim to eat 2-4 cups of fresh and frozen vegetables each day, including different varieties and colors. One cup  of vegetables equals 1 cup (91 g) broccoli or cauliflower florets, 2 medium carrots, 2 cups (150 g) raw, leafy greens, 1 large tomato, 1 large bell pepper, 1 large sweet potato, or 1 medium white potato. Grains Aim to eat 5-10 ounce-equivalents of whole grains each day. Examples of 1 ounce-equivalent of grains include 1 slice of bread, 1 cup (40 g) ready-to-eat cereal, 3 cups (24 g) popcorn, or  cup (93 g) cooked rice. Meats and other proteins Try to eat 5-7 ounce-equivalents of protein each day. Examples of 1 ounce-equivalent of protein include 1 egg,  oz nuts (12 almonds, 24 pistachios, or 7 walnut halves), 1/4 cup (90 g) cooked beans, 6 tablespoons (90 g) hummus or 1 tablespoon (16 g) peanut butter. A cut of meat or fish that is the size of a deck of cards is about 3-4 ounce-equivalents (85 g). Of the protein you eat each week, try to have at least 8 sounce (227 g) of seafood. This is about 2 servings per week. This includes salmon, trout, herring, sardines, and anchovies. Dairy Aim to eat 3 cup-equivalents of fat-free or low-fat dairy each day. Examples of 1 cup-equivalent of dairy include 1 cup (240 mL) milk, 8 ounces (250 g) yogurt, 1 ounces (44 g) natural cheese, or 1 cup (240 mL) fortified soy milk. Fats and oils Aim for about 5 teaspoons (21 g) of fats and oils per day. Choose monounsaturated fats, such as canola and olive oils, mayonnaise made with olive oil or avocado oil, avocados, peanut butter, and most nuts, or polyunsaturated fats, such as sunflower, corn, and soybean oils, walnuts, pine nuts, sesame seeds, sunflower seeds, and flaxseed. Beverages Aim for 6 eight-ounce glasses of water  per day. Limit coffee to 3-5 eight-ounce cups per day. Limit caffeinated beverages that have added calories, such as soda and energy drinks. If you drink alcohol: Limit how much you have to: 0-1 drink a day if you are male. 0-2 drinks a day if you are male. Know how much alcohol is in your drink.  In the [place] one drink is one 12 oz bottle of beer (355 mL), one 5 oz glass of wine (148 mL), or one 1 oz glass of hard liquor (44 mL). Seasoning and other foods Try not to add too much salt to your food. Try using herbs and spices instead of salt. Try not to add sugar to food. This information is based on [place] nutrition guidelines. To learn more, visit [employer]. Exact amounts may vary. You may need different amounts. This information is not intended to replace advice given to you by your health care provider. Make sure you discuss any questions you have with your health care provider. Document Revised: 02/28/2022 Document Reviewed: 02/28/2022 Elsevier Patient Education  2024 [employer].

## 2024-03-06 ENCOUNTER — Ambulatory Visit (INDEPENDENT_AMBULATORY_CARE_PROVIDER_SITE_OTHER): Payer: Self-pay | Admitting: Nurse Practitioner

## 2024-03-06 ENCOUNTER — Encounter: Payer: Self-pay | Admitting: Nurse Practitioner

## 2024-03-06 VITALS — BP 119/76 | HR 97 | Temp 98.1°F | Resp 15 | Ht 70.79 in | Wt 173.0 lb

## 2024-03-06 DIAGNOSIS — N50812 Left testicular pain: Secondary | ICD-10-CM

## 2024-03-06 LAB — MICROSCOPIC EXAMINATION
Bacteria, UA: NONE SEEN
RBC, Urine: NONE SEEN /HPF (ref 0–2)

## 2024-03-06 LAB — URINALYSIS, ROUTINE W REFLEX MICROSCOPIC
Bilirubin, UA: NEGATIVE
Glucose, UA: NEGATIVE
Leukocytes,UA: NEGATIVE
Nitrite, UA: NEGATIVE
RBC, UA: NEGATIVE
Specific Gravity, UA: >=1.03 — AB (ref 1.005–1.030)
Urobilinogen, Ur: 0.2 mg/dL (ref 0.2–1.0)
pH, UA: 5.5 (ref 5.0–7.5)

## 2024-03-06 MED ORDER — SULFAMETHOXAZOLE-TRIMETHOPRIM 800-160 MG PO TABS: 1.0000 | ORAL_TABLET | Freq: Two times a day (BID) | ORAL | 0 refills | Status: AC

## 2024-03-06 NOTE — Progress Notes (Signed)
 BP 119/76 (BP Location: Left Arm, Patient Position: Sitting, Cuff Size: Normal)   Pulse 97   Temp 98.1 F (36.7 C) (Oral)   Resp 15   Ht 5' 10.79 (1.798 m)   Wt 173 lb (78.5 kg)   SpO2 99%   BMI 24.27 kg/m    Subjective:    Patient ID: Jason Griffin, male    DOB: 1996/04/19, 28 y.o.   MRN: [id number]  HPI: Jason Griffin is a 28 y.o. male  Chief Complaint  Patient presents with   Follow-up    Concern for area of discomfort over the past couple months. Refers to the muscles as being the most painful. Swelling at times but not affecting his ability to function.    LEFT TESTICULAR PAIN Has been present for 2 months.  Prior to it showing up may have done some heavy lifting, works at [employer]. Does have some lingering back pain from June. Sometimes feels like cords are swollen. This is making her anxious. Duration: months Mechanism of injury: unknown Onset: sudden Severity: 2/10 Quality: dull, aching, and throbbing (like muscles are sore) Frequency: varies, if thinks about it notices it more Radiation: none Aggravating factors: sitting awkwardly Alleviating factors: not thinking about it, drinking fluids Status: stable Treatments attempted: nothing  Relief with NSAIDs?: No NSAIDs Taken Sexually Active:  no -- has been a few years History of STDs:  no Fevers:  no Dysuria / urinary frequency:  no     03/06/2024    1:22 PM 12/16/2020   11:14 AM 06/11/2019   12:46 PM 11/16/2018    9:55 AM  Depression screen PHQ 2/9  Decreased Interest 0 0 2 1  Down, Depressed, Hopeless 0 0 2 1  PHQ - 2 Score 0 0 4 2  Altered sleeping 0  3 2  Tired, decreased energy 0  0 0  Change in appetite 0  2 1  Feeling bad or failure about yourself  0  1 1  Trouble concentrating 0  0 0  Moving slowly or fidgety/restless 0  0 0  Suicidal thoughts 0  0 0  PHQ-9 Score 0  10 6  Difficult doing work/chores   Somewhat difficult Not difficult at all       03/06/2024    1:22 PM 06/11/2019    12:46 PM 11/16/2018    9:56 AM  GAD 7 : Generalized Anxiety Score  Nervous, Anxious, on Edge 3 1 2   Control/stop worrying 3 1 2   Worry too much - different things 2 1 2   Trouble relaxing 2 0 2  Restless 0 0 2  Easily annoyed or irritable 2 2 2   Afraid - awful might happen 3 1 2   Total GAD 7 Score 15 6 14   Anxiety Difficulty Very difficult Not difficult at all Not difficult at all   Relevant past medical, surgical, family and social history reviewed and updated as indicated. Interim medical history since our last visit reviewed. Allergies and medications reviewed and updated.  Review of Systems  Constitutional:  Negative for activity change, appetite change, diaphoresis, fatigue and fever.  Respiratory:  Negative for cough, chest tightness, shortness of breath and wheezing.   Cardiovascular:  Negative for chest pain, palpitations and leg swelling.  Gastrointestinal:  Negative for abdominal distention, abdominal pain, nausea and vomiting.  Genitourinary:  Positive for scrotal swelling and testicular pain. Negative for dysuria, frequency, penile discharge, penile pain, penile swelling and urgency.  Neurological: Negative.   Psychiatric/Behavioral: Negative.  Per HPI unless specifically indicated above     Objective:    BP 119/76 (BP Location: Left Arm, Patient Position: Sitting, Cuff Size: Normal)   Pulse 97   Temp 98.1 F (36.7 C) (Oral)   Resp 15   Ht 5' 10.79 (1.798 m)   Wt 173 lb (78.5 kg)   SpO2 99%   BMI 24.27 kg/m   Wt Readings from Last 3 Encounters:  03/06/24 173 lb (78.5 kg)  11/15/23 171 lb (77.6 kg)  01/28/21 171 lb 2 oz (77.6 kg)    Physical Exam Vitals and nursing note reviewed. Exam conducted with a chaperone present.  Constitutional:      General: He is awake. He is not in acute distress.    Appearance: Normal appearance. He is well-developed and well-groomed. He is not ill-appearing or toxic-appearing.  HENT:     Head: Normocephalic.     Right Ear:  Hearing and external ear normal.     Left Ear: Hearing and external ear normal.  Eyes:     General: Lids are normal.     Extraocular Movements: Extraocular movements intact.     Conjunctiva/sclera: Conjunctivae normal.  Neck:     Thyroid: No thyromegaly.     Vascular: No carotid bruit.  Cardiovascular:     Rate and Rhythm: Normal rate and regular rhythm.     Heart sounds: Normal heart sounds. No murmur heard.    No gallop.  Pulmonary:     Effort: No accessory muscle usage or respiratory distress.     Breath sounds: Normal breath sounds. No decreased breath sounds, wheezing or rales.  Abdominal:     General: Bowel sounds are normal. There is no distension.     Palpations: Abdomen is soft.     Tenderness: There is no abdominal tenderness. There is no right CVA tenderness or left CVA tenderness.     Hernia: There is no hernia in the left inguinal area or right inguinal area.  Genitourinary:    Penis: Normal.      Testes:        Right: Mass, tenderness or swelling not present. Cremasteric reflex is present.         Left: Tenderness and swelling present. Left testis is descended. Cremasteric reflex is present.      Epididymis:     Right: Normal.     Left: Enlarged. Tenderness present. No mass.  Musculoskeletal:     Cervical back: Full passive range of motion without pain.     Right lower leg: No edema.     Left lower leg: No edema.  Lymphadenopathy:     Cervical: No cervical adenopathy.  Skin:    General: Skin is warm.     Capillary Refill: Capillary refill takes less than 2 seconds.  Neurological:     Mental Status: He is alert and oriented to person, place, and time.     Deep Tendon Reflexes: Reflexes are normal and symmetric.     Reflex Scores:      Brachioradialis reflexes are 2+ on the right side and 2+ on the left side.      Patellar reflexes are 2+ on the right side and 2+ on the left side. Psychiatric:        Attention and Perception: Attention normal.        Mood and  Affect: Mood normal.        Speech: Speech normal.        Behavior: Behavior normal. Behavior is  cooperative.        Thought Content: Thought content normal.     Results for orders placed or performed in visit on 02/07/22  Novel Coronavirus, NAA ([facility])   Collection Time: 02/07/22  4:31 PM   Specimen: Nasopharyngeal(NP) swabs in vial transport medium  Result Value Ref Range   SARS-CoV-2, NAA Not Detected Not Detected      Assessment & Plan:   Problem List Items Addressed This Visit       Other   Left testicular pain - Primary   Acute for 2 months, not improving.  ?epididymitis present.  Has not been sexually active in some time.  Will send in Bactrim  for treatment.  Check UA.  Educated him on this.  Order for ultrasound to further assess.  No red flags on exam.      Relevant Orders   Urinalysis, Routine w reflex microscopic   US  SCROTUM W/DOPPLER     Follow up plan: Return in about 3 weeks (around 03/27/2024) for Left testicle pain.

## 2024-03-06 NOTE — Assessment & Plan Note (Signed)
 Acute for 2 months, not improving.  ?epididymitis present.  Has not been sexually active in some time.  Will send in Bactrim  for treatment.  Check UA.  Educated him on this.  Order for ultrasound to further assess.  No red flags on exam.

## 2024-03-11 ENCOUNTER — Encounter: Payer: Self-pay | Admitting: Nurse Practitioner

## 2024-03-20 ENCOUNTER — Ambulatory Visit
Admission: RE | Admit: 2024-03-20 | Discharge: 2024-03-20 | Disposition: A | Discharge status: Home / Self Care | Payer: Self-pay | Source: Ambulatory Visit | Attending: Nurse Practitioner | Admitting: Nurse Practitioner

## 2024-03-20 DIAGNOSIS — N50812 Left testicular pain: Secondary | ICD-10-CM | POA: Insufficient documentation

## 2024-03-26 ENCOUNTER — Ambulatory Visit: Payer: Self-pay | Admitting: Nurse Practitioner

## 2024-03-26 NOTE — Progress Notes (Signed)
 Contacted via MyChart  Good morning Jason Griffin, I have good news. Your ultrasound returned normal with no concerning findings.  This is good news. No cancerous findings.

## 2024-03-31 NOTE — Patient Instructions (Signed)
 Healthy Eating, Adult Healthy eating may help you get and keep a healthy body weight, reduce the risk of chronic disease, and live a long and productive life. It is important to follow a healthy eating pattern. Your nutritional and calorie needs should be met mainly by different nutrient-rich foods. What are tips for following this plan? Reading food labels Read labels and choose the following: Reduced or low sodium products. Juices with 100% fruit juice. Foods with low saturated fats (<3 g per serving) and high polyunsaturated and monounsaturated fats. Foods with whole grains, such as whole wheat, cracked wheat, brown rice, and wild rice. Whole grains that are fortified with folic acid . This is recommended for females who are pregnant or who want to become pregnant. Read labels and do not eat or drink the following: Foods or drinks with added sugars. These include foods that contain brown sugar, corn sweetener, corn syrup, dextrose , fructose, glucose, high-fructose corn syrup, honey, invert sugar, lactose, malt syrup, maltose, molasses, raw sugar, sucrose, trehalose, or turbinado sugar. Limit your intake of added sugars to less than 10% of your total daily calories. Do not eat more than the following amounts of added sugar per day: 6 teaspoons (25 g) for females. 9 teaspoons (38 g) for males. Foods that contain processed or refined starches and grains. Refined grain products, such as white flour, degermed cornmeal, white bread, and white rice. Shopping Choose nutrient-rich snacks, such as vegetables, whole fruits, and nuts. Avoid high-calorie and high-sugar snacks, such as potato chips, fruit snacks, and candy. Use oil-based dressings and spreads on foods instead of solid fats such as butter, margarine, sour cream, or cream cheese. Limit pre-made sauces, mixes, and instant products such as flavored rice, instant noodles, and ready-made pasta. Try more plant-protein sources, such as tofu,  tempeh, black beans, edamame, lentils, nuts, and seeds. Explore eating plans such as the Mediterranean diet or vegetarian diet. Try heart-healthy dips made with beans and healthy fats like hummus and guacamole. Vegetables go great with these. Cooking Use oil to saut or stir-fry foods instead of solid fats such as butter, margarine, or lard. Try baking, boiling, grilling, or broiling instead of frying. Remove the fatty part of meats before cooking. Steam vegetables in water  or broth. Meal planning  At meals, imagine dividing your plate into fourths: One-half of your plate is fruits and vegetables. One-fourth of your plate is whole grains. One-fourth of your plate is protein, especially lean meats, poultry, eggs, tofu, beans, or nuts. Include low-fat dairy as part of your daily diet. Lifestyle Choose healthy options in all settings, including home, work, school, restaurants, or stores. Prepare your food safely: Wash your hands after handling raw meats. Where you prepare food, keep surfaces clean by regularly washing with hot, soapy water . Keep raw meats separate from ready-to-eat foods, such as fruits and vegetables. Cook seafood, meat, poultry, and eggs to the recommended temperature. Get a food thermometer. Store foods at safe temperatures. In general: Keep cold foods at 34F (4.4C) or below. Keep hot foods at 134F (60C) or above. Keep your freezer at [facility] (-17.8C) or below. Foods are not safe to eat if they have been between the temperatures of 40-134F (4.4-60C) for more than 2 hours. What foods should I eat? Fruits Aim to eat 1-2 cups of fresh, canned (in natural juice), or frozen fruits each day. One cup of fruit equals 1 small apple, 1 large banana, 8 large strawberries, 1 cup (237 g) canned fruit,  cup (82 g) dried fruit,  or 1 cup (240 mL) 100% juice. Vegetables Aim to eat 2-4 cups of fresh and frozen vegetables each day, including different varieties and colors. One cup  of vegetables equals 1 cup (91 g) broccoli or cauliflower florets, 2 medium carrots, 2 cups (150 g) raw, leafy greens, 1 large tomato, 1 large bell pepper, 1 large sweet potato, or 1 medium white potato. Grains Aim to eat 5-10 ounce-equivalents of whole grains each day. Examples of 1 ounce-equivalent of grains include 1 slice of bread, 1 cup (40 g) ready-to-eat cereal, 3 cups (24 g) popcorn, or  cup (93 g) cooked rice. Meats and other proteins Try to eat 5-7 ounce-equivalents of protein each day. Examples of 1 ounce-equivalent of protein include 1 egg,  oz nuts (12 almonds, 24 pistachios, or 7 walnut halves), 1/4 cup (90 g) cooked beans, 6 tablespoons (90 g) hummus or 1 tablespoon (16 g) peanut butter. A cut of meat or fish that is the size of a deck of cards is about 3-4 ounce-equivalents (85 g). Of the protein you eat each week, try to have at least 8 sounce (227 g) of seafood. This is about 2 servings per week. This includes salmon, trout, herring, sardines, and anchovies. Dairy Aim to eat 3 cup-equivalents of fat-free or low-fat dairy each day. Examples of 1 cup-equivalent of dairy include 1 cup (240 mL) milk, 8 ounces (250 g) yogurt, 1 ounces (44 g) natural cheese, or 1 cup (240 mL) fortified soy milk. Fats and oils Aim for about 5 teaspoons (21 g) of fats and oils per day. Choose monounsaturated fats, such as canola and olive oils, mayonnaise made with olive oil or avocado oil, avocados, peanut butter, and most nuts, or polyunsaturated fats, such as sunflower, corn, and soybean oils, walnuts, pine nuts, sesame seeds, sunflower seeds, and flaxseed. Beverages Aim for 6 eight-ounce glasses of water  per day. Limit coffee to 3-5 eight-ounce cups per day. Limit caffeinated beverages that have added calories, such as soda and energy drinks. If you drink alcohol: Limit how much you have to: 0-1 drink a day if you are male. 0-2 drinks a day if you are male. Know how much alcohol is in your drink.  In the [place] one drink is one 12 oz bottle of beer (355 mL), one 5 oz glass of wine (148 mL), or one 1 oz glass of hard liquor (44 mL). Seasoning and other foods Try not to add too much salt to your food. Try using herbs and spices instead of salt. Try not to add sugar to food. This information is based on [place] nutrition guidelines. To learn more, visit [employer]. Exact amounts may vary. You may need different amounts. This information is not intended to replace advice given to you by your health care provider. Make sure you discuss any questions you have with your health care provider. Document Revised: 02/28/2022 Document Reviewed: 02/28/2022 Elsevier Patient Education  2024 [employer].

## 2024-04-03 ENCOUNTER — Encounter: Payer: Self-pay | Admitting: Nurse Practitioner

## 2024-04-03 ENCOUNTER — Ambulatory Visit (INDEPENDENT_AMBULATORY_CARE_PROVIDER_SITE_OTHER): Payer: Self-pay | Admitting: Nurse Practitioner

## 2024-04-03 ENCOUNTER — Ambulatory Visit: Payer: Self-pay | Admitting: Nurse Practitioner

## 2024-04-03 VITALS — BP 122/87 | HR 144 | Temp 98.4°F | Resp 15 | Ht 70.79 in | Wt 175.4 lb

## 2024-04-03 DIAGNOSIS — N50812 Left testicular pain: Secondary | ICD-10-CM

## 2024-04-03 NOTE — Assessment & Plan Note (Signed)
 Acute for 3 months, not improving.  ?epididymitis present, but imaging was reassuring and he did not tolerate abx therapy.  Has not been sexually active in some time.  No red flags on exam. He continues to report ongoing discomfort and changes to left side.  Will place referral to urology for assessment and reassurance.

## 2024-04-03 NOTE — Progress Notes (Signed)
 BP 122/87 (BP Location: Left Arm, Patient Position: Sitting, Cuff Size: Normal)   Pulse (!) 144   Temp 98.4 F (36.9 C) (Oral)   Resp 15   Ht 5' 10.79 (1.798 m)   Wt 175 lb 6.4 oz (79.6 kg)   SpO2 98%   BMI 24.61 kg/m    Subjective:    Patient ID: Jason Griffin, male    DOB: [birth year], 28 y.o.   MRN: [id number]  HPI: SAMIL MECHAM is a 28 y.o. male  Chief Complaint  Patient presents with   Left testicular pain    Does not feel there has been any change since previous visit.    LEFT TESTICULAR PAIN Follow-up today for left testicular pain. Has been present for 3 months. Had done some heavy lifting prior to it presenting, works at [employer]. Recent imaging on 03/20/24 showed no abnormalities. Continues to complain of discomfort to area. He did not complete full antibiotic treatment, made him throw up, Bactrim . Currently wears boxers. Continues to report that when at home and relaxed it does not look right. Duration: months Mechanism of injury: unknown Onset: sudden Severity: none currently, does notice when thinks about too much Quality: like a sore muscle, dull and aching Frequency: varies, if thinks about it notices it more Radiation: none Aggravating factors: sitting in certain positions Alleviating factors: not thinking about it, drinking alcohol to help relax Status: ongoing, no worse or better Treatments attempted: as above Relief with NSAIDs?: No NSAIDs Taken Sexually Active:  no -- has been a few years History of STDs:  no Fevers:  no Dysuria / urinary frequency:  no     04/03/2024    1:21 PM 03/06/2024    1:22 PM 12/16/2020   11:14 AM 06/11/2019   12:46 PM 11/16/2018    9:55 AM  Depression screen PHQ 2/9  Decreased Interest 0 0 0 2 1  Down, Depressed, Hopeless 0 0 0 2 1  PHQ - 2 Score 0 0 0 4 2  Altered sleeping 2 0  3 2  Tired, decreased energy 2 0  0 0  Change in appetite 2 0  2 1  Feeling bad or failure about yourself  0 0  1 1  Trouble  concentrating 0 0  0 0  Moving slowly or fidgety/restless 0 0  0 0  Suicidal thoughts 0 0  0 0  PHQ-9 Score 6 0  10 6  Difficult doing work/chores Somewhat difficult   Somewhat difficult Not difficult at all       04/03/2024    1:22 PM 03/06/2024    1:22 PM 06/11/2019   12:46 PM 11/16/2018    9:56 AM  GAD 7 : Generalized Anxiety Score  Nervous, Anxious, on Edge 2 3 1 2   Control/stop worrying 2 3 1 2   Worry too much - different things 2 2 1 2   Trouble relaxing 2 2 0 2  Restless 0 0 0 2  Easily annoyed or irritable 2 2 2 2   Afraid - awful might happen 2 3 1 2   Total GAD 7 Score 12 15 6 14   Anxiety Difficulty Very difficult Very difficult Not difficult at all Not difficult at all   Relevant past medical, surgical, family and social history reviewed and updated as indicated. Interim medical history since our last visit reviewed. Allergies and medications reviewed and updated.  Review of Systems  Constitutional:  Negative for activity change, appetite change, diaphoresis, fatigue and fever.  Respiratory:  Negative for cough, chest tightness, shortness of breath and wheezing.   Cardiovascular:  Negative for chest pain, palpitations and leg swelling.  Gastrointestinal:  Negative for abdominal distention, abdominal pain, nausea and vomiting.  Genitourinary:  Positive for scrotal swelling and testicular pain. Negative for dysuria, frequency, penile discharge, penile pain, penile swelling and urgency.  Neurological: Negative.   Psychiatric/Behavioral: Negative.     Per HPI unless specifically indicated above     Objective:    BP 122/87 (BP Location: Left Arm, Patient Position: Sitting, Cuff Size: Normal)   Pulse (!) 144   Temp 98.4 F (36.9 C) (Oral)   Resp 15   Ht 5' 10.79 (1.798 m)   Wt 175 lb 6.4 oz (79.6 kg)   SpO2 98%   BMI 24.61 kg/m   Wt Readings from Last 3 Encounters:  04/03/24 175 lb 6.4 oz (79.6 kg)  03/06/24 173 lb (78.5 kg)  11/15/23 171 lb (77.6 kg)     Physical Exam Vitals and nursing note reviewed.  Constitutional:      General: He is awake. He is not in acute distress.    Appearance: He is well-developed and well-groomed. He is not ill-appearing or toxic-appearing.  HENT:     Head: Normocephalic.     Right Ear: Hearing and external ear normal.     Left Ear: Hearing and external ear normal.  Eyes:     General: Lids are normal.     Extraocular Movements: Extraocular movements intact.     Conjunctiva/sclera: Conjunctivae normal.  Neck:     Thyroid: No thyromegaly.     Vascular: No carotid bruit.  Cardiovascular:     Rate and Rhythm: Normal rate and regular rhythm.     Heart sounds: Normal heart sounds. No murmur heard.    No gallop.  Pulmonary:     Effort: No accessory muscle usage or respiratory distress.     Breath sounds: Normal breath sounds.  Abdominal:     General: Bowel sounds are normal. There is no distension.     Palpations: Abdomen is soft.     Tenderness: There is no abdominal tenderness.  Musculoskeletal:     Cervical back: Full passive range of motion without pain.     Right lower leg: No edema.     Left lower leg: No edema.  Lymphadenopathy:     Cervical: No cervical adenopathy.  Skin:    General: Skin is warm.     Capillary Refill: Capillary refill takes less than 2 seconds.  Neurological:     Mental Status: He is alert and oriented to person, place, and time.     Deep Tendon Reflexes: Reflexes are normal and symmetric.     Reflex Scores:      Brachioradialis reflexes are 2+ on the right side and 2+ on the left side.      Patellar reflexes are 2+ on the right side and 2+ on the left side. Psychiatric:        Attention and Perception: Attention normal.        Mood and Affect: Mood normal.        Speech: Speech normal.        Behavior: Behavior normal. Behavior is cooperative.        Thought Content: Thought content normal.    Results for orders placed or performed in visit on 03/06/24  Microscopic  Examination   Collection Time: 03/06/24  1:31 PM   Urine  Result Value Ref Range  WBC, UA 0-5 0 - 5 /hpf   RBC, Urine None seen 0 - 2 /hpf   Epithelial Cells (non renal) 0-10 0 - 10 /hpf   Mucus, UA Present (A) Not Estab.   Bacteria, UA None seen None seen/Few  Urinalysis, Routine w reflex microscopic   Collection Time: 03/06/24  1:31 PM  Result Value Ref Range   Specific Gravity, UA      >=1.030 (A) 1.005 - 1.030   pH, UA 5.5 5.0 - 7.5   Color, UA Yellow Yellow   Appearance Ur Clear Clear   Leukocytes,UA Negative Negative   Protein,UA 1+ (A) Negative/Trace   Glucose, UA Negative Negative   Ketones, UA Trace (A) Negative   RBC, UA Negative Negative   Bilirubin, UA Negative Negative   Urobilinogen, Ur 0.2 0.2 - 1.0 mg/dL   Nitrite, UA Negative Negative   Microscopic Examination See below:       Assessment & Plan:   Problem List Items Addressed This Visit       Other   Left testicular pain - Primary   Acute for 3 months, not improving.  ?epididymitis present, but imaging was reassuring and he did not tolerate abx therapy.  Has not been sexually active in some time.  No red flags on exam. He continues to report ongoing discomfort and changes to left side.  Will place referral to urology for assessment and reassurance.      Relevant Orders   Ambulatory referral to Urology      Follow up plan: Return if symptoms worsen or fail to improve.

## 2024-04-08 ENCOUNTER — Encounter: Payer: Self-pay | Admitting: Nurse Practitioner

## 2024-05-03 ENCOUNTER — Ambulatory Visit (INDEPENDENT_AMBULATORY_CARE_PROVIDER_SITE_OTHER): Payer: Self-pay | Admitting: Urology

## 2024-05-03 VITALS — BP 129/91 | HR 97 | Wt 170.8 lb

## 2024-05-03 DIAGNOSIS — R102 Pelvic and perineal pain unspecified side: Secondary | ICD-10-CM

## 2024-05-03 DIAGNOSIS — N5082 Scrotal pain: Secondary | ICD-10-CM

## 2024-05-03 MED ORDER — CELECOXIB 200 MG PO CAPS: 200.0000 mg | ORAL_CAPSULE | Freq: Two times a day (BID) | ORAL | 0 refills | Status: AC

## 2024-05-03 NOTE — Progress Notes (Signed)
   05/03/24 11:56 AM   Alyson W Porter 10-Apr-1996 [id number]  CC: Left scrotal pain  HPI: 28 year old male with anxiety with a few weeks of left-sided scrotal discomfort/swelling.  He denies any aggravating or inciting events.  Scrotal ultrasound with PCP was benign.  He was treated with antibiotics but did not tolerate antibiotics secondary to GI upset.  When he thinks about it it seems to be worse but if he is distracted it is not as bad.  Urinalysis with PCP benign   PMH: Past Medical History:  Diagnosis Date   ADD (attention deficit disorder)    Anxiety     Surgical History: Past Surgical History:  Procedure Laterality Date   NO PAST SURGERIES       Family History: Family History  Problem Relation Age of Onset   Autoimmune disease Mother    Arthritis Mother    Healthy Father    Dementia Maternal Grandmother     Social History:  reports that he has quit smoking. His smoking use included cigarettes. He started smoking about 14 years ago. He has a 6.8 pack-year smoking history. He quit smokeless tobacco use about 7 years ago.  His smokeless tobacco use included chew. He reports current alcohol use. He reports that he does not use drugs.  Physical Exam: BP (!) 129/91 (BP Location: Left Arm, Patient Position: Sitting, Cuff Size: Normal)   Pulse 97   Wt 170 lb 12.8 oz (77.5 kg)   SpO2 98%   BMI 23.97 kg/m    Constitutional:  Alert and oriented, No acute distress. Cardiovascular: No clubbing, cyanosis, or edema. Respiratory: Normal respiratory effort, no increased work of breathing. GI: Abdomen is soft, nontender, nondistended, no abdominal masses GU: Normal-appearing phallus, no lesions, testicles 20 cc and descended bilaterally without masses, extremely sensitive bilaterally to gentle exam  Laboratory Data: Urinalysis benign  Pertinent Imaging: I have personally viewed and interpreted the scrotal ultrasound with no abnormal findings.  Assessment & Plan:    28 year old male with left scrotal discomfort and swelling, benign scrotal ultrasound, exam, and urinalysis.  Reassurance was provided.  We discussed the concept of pelvic floor dysfunction I recommend a trial of NSAIDs and pelvic floor stretches  Follow-up with urology as needed  Redell Burnet, MD 05/03/2024  [facility] [address], [address] [place], [place] [address] [phone]

## 2024-05-03 NOTE — Patient Instructions (Signed)
 Pelvic Floor Dysfunction, Male     Pelvic floor dysfunction (PFD) is a condition that results when the group of muscles and connective tissues that support the organs in the pelvis (pelvic floor muscles) do not work well. These muscles and their connections form a sling that supports the colon and bladder. In men, these muscles also support the prostate gland. PFD causes pelvic floor muscles to be too weak, too tight, or both. In PFD, muscle movements are not coordinated. This may cause bowel or bladder problems. It may also cause pain. What are the causes? This condition may be caused by an injury to the pelvic area or by a weakening of pelvic muscles. In many cases, the exact cause is not known. What increases the risk? The following factors may make you more likely to develop PFD: Having chronic bladder tissue inflammation (interstitial cystitis). Being an older person. Being overweight. History of radiation treatment for cancer in the pelvic region. Previous pelvic surgery, such as removal of the prostate gland (prostatectomy). What are the signs or symptoms? Symptoms of this condition vary and may include: Bladder symptoms, such as: Trouble starting urination and emptying the bladder. Frequent urinary tract infections. Leaking urine when coughing, laughing, or exercising (stress incontinence). Having to pass urine urgently or frequently. Pain when passing urine. Bowel symptoms, such as: Constipation. Urgent or frequent bowel movements. Incomplete bowel movements. Painful bowel movements. Leaking stool or gas. Unexplained genital or rectal pain. Genital or rectal muscle spasms. Low back pain. Sexual dysfunction, such as erectile dysfunction, premature ejaculation, or pain during or after sexual activity. How is this diagnosed? This condition is diagnosed based on: Your symptoms and medical history. A physical exam. During the exam, your health care provider may check your  pelvic muscles for tightness, spasm, pain, or weakness. This may include a rectal exam. In some cases, you may have diagnostic tests, such as: Electrical muscle function tests. Urine flow testing. X-ray tests of bowel function. Ultrasound of the pelvic organs. How is this treated? Treatment for this condition depends on your symptoms. Treatment options include: Physical therapy. This may include Kegel exercises to help relax or strengthen the pelvic floor muscles. Biofeedback. This type of therapy provides feedback on how tight your pelvic floor muscles are so that you can learn to control them. Medicines, such as: Muscle relaxants. Bladder control medicines. Follow these instructions at home: Activity Do your usual activities as told by your health care provider. Ask your health care provider if you should modify any activities. Do pelvic floor strengthening or relaxing exercises at home as told by your physical therapist. Lifestyle Maintain a healthy weight. Eat foods that are high in fiber, such as beans, whole grains, and fresh fruits and vegetables. Limit foods that are high in fat and processed sugars, such as fried or sweet foods. Manage stress with relaxation techniques such as yoga or meditation. General instructions If you have problems with leakage: Use absorbable pads or wear padded underwear. Wash your genital and anal area frequently with mild soap. Keep your genital and anal area as clean and dry as possible. Ask your health care provider if you should try a barrier cream to prevent skin irritation. Take warm baths to relieve pelvic muscle tension or spasms. Take over-the-counter and prescription medicines only as told by your health care provider. Keep all follow-up visits. How is this prevented? The cause of PFD is not always known, but there are a few things you can do to reduce the risk  of developing this condition, including: Staying at a healthy weight. Getting  regular exercise. Managing stress. Contact a health care provider if: Your symptoms are not improving with home care. You have signs or symptoms of PFD that get worse. You develop new signs or symptoms. You have signs of a urinary tract infection, such as: Fever. Chills. Increased urinary frequency. A burning feeling when urinating. You have not had a bowel movement in 3 days (constipation). Summary Pelvic floor dysfunction results when the muscles and connective tissues in your pelvic floor do not work well. These muscles and their connections form a sling that supports your colon and bladder. In men, these muscles also support the prostate gland. PFD may be caused by an injury to the pelvic area or by a weakening of pelvic muscles. PFD causes pelvic floor muscles to be too weak, too tight, or a combination of both. Symptoms may vary from person to person. In most cases, PFD can be treated with physical therapies and medicines. Surgery may be an option if other treatments do not help. This information is not intended to replace advice given to you by your health care provider. Make sure you discuss any questions you have with your health care provider.
